# Patient Record
Sex: Male | Born: 1995 | Race: White | Hispanic: No | Marital: Single | Smoking: Current every day smoker
Health system: Southern US, Community
[De-identification: ages and names within clinical notes are randomized; demographics above are authoritative.]

## PROBLEM LIST (undated history)

## (undated) DIAGNOSIS — J189 Pneumonia, unspecified organism: Secondary | ICD-10-CM

## (undated) DIAGNOSIS — F419 Anxiety disorder, unspecified: Secondary | ICD-10-CM

## (undated) HISTORY — PX: MOUTH SURGERY: SHX715

---

## 2016-05-19 ENCOUNTER — Encounter: Payer: Self-pay | Admitting: Emergency Medicine

## 2016-05-19 ENCOUNTER — Emergency Department
Admission: EM | Admit: 2016-05-19 | Discharge: 2016-05-19 | Disposition: A | Discharge status: Home / Self Care | Payer: Self-pay | Attending: Emergency Medicine | Admitting: Emergency Medicine

## 2016-05-19 ENCOUNTER — Emergency Department: Payer: Self-pay

## 2016-05-19 DIAGNOSIS — F17200 Nicotine dependence, unspecified, uncomplicated: Secondary | ICD-10-CM | POA: Insufficient documentation

## 2016-05-19 DIAGNOSIS — R110 Nausea: Secondary | ICD-10-CM | POA: Insufficient documentation

## 2016-05-19 DIAGNOSIS — R05 Cough: Secondary | ICD-10-CM | POA: Insufficient documentation

## 2016-05-19 DIAGNOSIS — F439 Reaction to severe stress, unspecified: Secondary | ICD-10-CM | POA: Insufficient documentation

## 2016-05-19 DIAGNOSIS — R059 Cough, unspecified: Secondary | ICD-10-CM

## 2016-05-19 DIAGNOSIS — R61 Generalized hyperhidrosis: Secondary | ICD-10-CM | POA: Insufficient documentation

## 2016-05-19 LAB — CBC
HEMATOCRIT: 44.4 % (ref 40.0–52.0)
Hemoglobin: 15.6 g/dL (ref 13.0–18.0)
MCH: 33.5 pg (ref 26.0–34.0)
MCHC: 35.1 g/dL (ref 32.0–36.0)
MCV: 95.5 fL (ref 80.0–100.0)
Platelets: 189 10*3/uL (ref 150–440)
RBC: 4.65 MIL/uL (ref 4.40–5.90)
RDW: 12.5 % (ref 11.5–14.5)
WBC: 5.6 10*3/uL (ref 3.8–10.6)

## 2016-05-19 LAB — COMPREHENSIVE METABOLIC PANEL
ALT: 14 U/L — ABNORMAL LOW (ref 17–63)
ANION GAP: 8 (ref 5–15)
AST: 19 U/L (ref 15–41)
Albumin: 4.3 g/dL (ref 3.5–5.0)
Alkaline Phosphatase: 56 U/L (ref 38–126)
BILIRUBIN TOTAL: 0.9 mg/dL (ref 0.3–1.2)
BUN: 12 mg/dL (ref 6–20)
CO2: 24 mmol/L (ref 22–32)
Calcium: 9.1 mg/dL (ref 8.9–10.3)
Chloride: 107 mmol/L (ref 101–111)
Creatinine, Ser: 0.91 mg/dL (ref 0.61–1.24)
GFR calc Af Amer: >60 mL/min (ref >60)
Glucose, Bld: 102 mg/dL — ABNORMAL HIGH (ref 65–99)
POTASSIUM: 4 mmol/L (ref 3.5–5.1)
Sodium: 139 mmol/L (ref 135–145)
TOTAL PROTEIN: 6.8 g/dL (ref 6.5–8.1)

## 2016-05-19 LAB — URINALYSIS COMPLETE WITH MICROSCOPIC (ARMC ONLY)
BACTERIA UA: NONE SEEN
BILIRUBIN URINE: NEGATIVE
Glucose, UA: NEGATIVE mg/dL
HGB URINE DIPSTICK: NEGATIVE
Ketones, ur: NEGATIVE mg/dL
NITRITE: NEGATIVE
PH: 7 (ref 5.0–8.0)
Protein, ur: NEGATIVE mg/dL
Specific Gravity, Urine: 1.012 (ref 1.005–1.030)
Squamous Epithelial / LPF: NONE SEEN

## 2016-05-19 LAB — RAPID HIV SCREEN (HIV 1/2 AB+AG)
HIV 1/2 ANTIBODIES: NONREACTIVE
HIV-1 P24 ANTIGEN - HIV24: NONREACTIVE

## 2016-05-19 NOTE — ED Triage Notes (Signed)
Reports night sweats x 5 months.  Also states has felt nauseated x 5 months.

## 2016-05-19 NOTE — ED Provider Notes (Signed)
[facility] Emergency Department Provider Note   ____________________________________________   First MD Initiated Contact with Patient 05/19/16 [phone]     (approximate)  I have reviewed the triage vital signs and the nursing notes.   HISTORY  Chief Complaint Nausea   HPI Raymond Cole is a 20 y.o. male without any chronic medical problems who is presenting to the emergency department with 5 months of nausea and night sweats. He says that he vomited 3 days ago which prompted him to come to the emergency department for further evaluation. He denies any sick contacts. He says that his room is cool with air conditioning in the evening so he says that he has no reason to be sweating. He also was in jail for most of the year prior to the symptoms starting. He says that the symptoms started about 2 months after leaving jail. He says that he also has an intermittent cough and smokes marijuana daily. He denies any sputum production or bloody sputum. Denies any fever.Patient says that he also has a tremendous amount of stress a little he does not want to be due to psychiatrist. He denies any feelings of suicidal or homicidal ideation. Thinks that his symptoms may be stress related. Denies any nausea at this time.   History reviewed. No pertinent past medical history.  There are no active problems to display for this patient.   History reviewed. No pertinent surgical history.  Prior to Admission medications   Medication Sig Start Date End Date Taking? Authorizing Provider  ibuprofen (ADVIL,MOTRIN) 200 MG tablet Take 800 mg by mouth every 6 (six) hours as needed for fever, mild pain or moderate pain.   Yes Historical Provider, MD    Allergies Review of patient's allergies indicates no known allergies.  No family history on file.  Social History Social History  Substance Use Topics  . Smoking status: Current Every Day Smoker  . Smokeless tobacco: Never Used    . Alcohol use Not on file    Review of Systems Constitutional: No fever/chills Eyes: No visual changes. ENT: No sore throat. Cardiovascular: Denies chest pain. Respiratory: Cough. Gastrointestinal: No abdominal pain.   No diarrhea.  No constipation. Genitourinary: Negative for dysuria. Musculoskeletal: Negative for back pain. Skin: Negative for rash. Neurological: Negative for headaches, focal weakness or numbness.  10-point ROS otherwise negative.  ____________________________________________   PHYSICAL EXAM:  VITAL SIGNS: ED Triage Vitals  Enc Vitals Group     BP 05/19/16 0934 137/90     Pulse Rate 05/19/16 0934 87     Resp 05/19/16 0934 18     Temp 05/19/16 0934 98.3 F (36.8 C)     Temp Source 05/19/16 0934 Oral     SpO2 05/19/16 0934 97 %     Weight 05/19/16 0935 180 lb (81.6 kg)     Height 05/19/16 0935 5\' 10"  (1.778 m)     Head Circumference --      Peak Flow --      Pain Score --      Pain Loc --      Pain Edu? --      Excl. in GC? --     Constitutional: Alert and oriented. Well appearing and in no acute distress. Eyes: Conjunctivae are normal. PERRL. EOMI. Head: Atraumatic. Nose: No congestion/rhinnorhea. Mouth/Throat: Mucous membranes are moist.  Neck: No stridor.   Cardiovascular: Normal rate, regular rhythm. Grossly normal heart sounds.  Good peripheral circulation. Respiratory: Normal respiratory effort.  No retractions. Lungs CTAB. Gastrointestinal: Soft and nontender. No distention. No abdominal bruits. No CVA tenderness. Musculoskeletal: No lower extremity tenderness nor edema.  No joint effusions. Neurologic:  Normal speech and language. No gross focal neurologic deficits are appreciated. No gait instability. Skin:  Skin is warm, dry and intact. No rash noted. Psychiatric: Mood and affect are normal. Speech and behavior are normal.  ____________________________________________   LABS (all labs ordered are listed, but only abnormal results  are displayed)  Labs Reviewed  COMPREHENSIVE METABOLIC PANEL - Abnormal; Notable for the following:       Result Value   Glucose, Bld 102 (*)    ALT 14 (*)    All other components within normal limits  URINALYSIS COMPLETEWITH MICROSCOPIC ([facility] ONLY) - Abnormal; Notable for the following:    Color, Urine YELLOW (*)    APPearance CLEAR (*)    Leukocytes, UA TRACE (*)    All other components within normal limits  CBC  RAPID HIV SCREEN (HIV 1/2 AB+AG)   ____________________________________________  EKG   ____________________________________________  RADIOLOGY  DG Chest 2 View (Accession [id number]) (Order [id number])  Imaging  Date: 05/19/2016 Department: [facility] EMERGENCY DEPARTMENT Released By: Ludwig LeanValerie Anne Chandler, RN (auto-released) Authorizing: Myrna Blazeravid Matthew Schaevitz, MD  PACS Images   Show images for DG Chest 2 View  Study Result   CLINICAL DATA:  Nonproductive cough for 3-4 months  EXAM: CHEST  2 VIEW  COMPARISON:  01/23/2012  FINDINGS: The heart size and mediastinal contours are within normal limits. Both lungs are clear. The visualized skeletal structures are unremarkable.  IMPRESSION: No active cardiopulmonary disease.   Electronically Signed   By: Elige KoHetal  Patel   On: 05/19/2016 11:09     ____________________________________________   PROCEDURES  Procedure(s) performed:   Procedures  Critical Care performed:   ____________________________________________   INITIAL IMPRESSION / ASSESSMENT AND PLAN / ED COURSE  Pertinent labs & imaging results that were available during my care of the patient were reviewed by me and considered in my medical decision making (see chart for details).  ----------------------------------------- 12:22 PM on 05/19/2016 -----------------------------------------  Patient resting a little bit this time. Continues to be without any nausea or vomiting. I discussed case Dr.  Sampson GoonFitzgerald the infectious to service he recommends an HIV test and given the patient does not have any active cough or bloody sputum or white count this time says that the patient sounds appropriate follow-up at the health department. The patient said that 2-3 months ago he had a negative HIV test. We'll retest at this time. Also said that he negative TB test in jail but the symptoms started after release. He understands the need for follow-up with the health department and is willing to comply. I will also be giving him follow-up information for [facility] because of his anxiety. He continues to deny any suicidal or homicidal ideation.  Clinical Course     ____________________________________________   FINAL CLINICAL IMPRESSION(S) / ED DIAGNOSES  stress. Nausea and night sweats.    NEW MEDICATIONS STARTED DURING THIS VISIT:  New Prescriptions   No medications on file     Note:  This document was prepared using Dragon voice recognition software and may include unintentional dictation errors.    Myrna Blazeravid Matthew Schaevitz, MD 05/19/16 [phone]

## 2017-03-04 ENCOUNTER — Encounter: Payer: Self-pay | Admitting: Emergency Medicine

## 2017-03-04 ENCOUNTER — Emergency Department: Payer: BLUE CROSS/BLUE SHIELD

## 2017-03-04 ENCOUNTER — Emergency Department
Admission: EM | Admit: 2017-03-04 | Discharge: 2017-03-04 | Disposition: A | Discharge status: Home / Self Care | Payer: BLUE CROSS/BLUE SHIELD | Attending: Student in an Organized Health Care Education/Training Program | Admitting: Student in an Organized Health Care Education/Training Program

## 2017-03-04 DIAGNOSIS — F1094 Alcohol use, unspecified with alcohol-induced mood disorder: Secondary | ICD-10-CM

## 2017-03-04 DIAGNOSIS — F17210 Nicotine dependence, cigarettes, uncomplicated: Secondary | ICD-10-CM | POA: Diagnosis not present

## 2017-03-04 DIAGNOSIS — Y998 Other external cause status: Secondary | ICD-10-CM | POA: Diagnosis not present

## 2017-03-04 DIAGNOSIS — S6991XA Unspecified injury of right wrist, hand and finger(s), initial encounter: Secondary | ICD-10-CM | POA: Diagnosis present

## 2017-03-04 DIAGNOSIS — W25XXXA Contact with sharp glass, initial encounter: Secondary | ICD-10-CM | POA: Diagnosis not present

## 2017-03-04 DIAGNOSIS — S61421A Laceration with foreign body of right hand, initial encounter: Secondary | ICD-10-CM | POA: Insufficient documentation

## 2017-03-04 DIAGNOSIS — F1010 Alcohol abuse, uncomplicated: Secondary | ICD-10-CM

## 2017-03-04 DIAGNOSIS — Y939 Activity, unspecified: Secondary | ICD-10-CM | POA: Insufficient documentation

## 2017-03-04 DIAGNOSIS — Y929 Unspecified place or not applicable: Secondary | ICD-10-CM | POA: Insufficient documentation

## 2017-03-04 DIAGNOSIS — F10929 Alcohol use, unspecified with intoxication, unspecified: Secondary | ICD-10-CM

## 2017-03-04 DIAGNOSIS — Y903 Blood alcohol level of 60-79 mg/100 ml: Secondary | ICD-10-CM | POA: Diagnosis not present

## 2017-03-04 LAB — CBC WITH DIFFERENTIAL/PLATELET
BASOS ABS: 0 10*3/uL (ref 0–0.1)
Basophils Relative: 0 %
EOS ABS: 0.2 10*3/uL (ref 0–0.7)
EOS PCT: 2 %
HCT: 47.8 % (ref 40.0–52.0)
Hemoglobin: 16.2 g/dL (ref 13.0–18.0)
LYMPHS ABS: 2.1 10*3/uL (ref 1.0–3.6)
Lymphocytes Relative: 20 %
MCH: 32.6 pg (ref 26.0–34.0)
MCHC: 33.9 g/dL (ref 32.0–36.0)
MCV: 96.1 fL (ref 80.0–100.0)
MONO ABS: 0.5 10*3/uL (ref 0.2–1.0)
Monocytes Relative: 5 %
NEUTROS PCT: 73 %
Neutro Abs: 7.8 10*3/uL — ABNORMAL HIGH (ref 1.4–6.5)
PLATELETS: 282 10*3/uL (ref 150–440)
RBC: 4.97 MIL/uL (ref 4.40–5.90)
RDW: 12.3 % (ref 11.5–14.5)
WBC: 10.8 10*3/uL — ABNORMAL HIGH (ref 3.8–10.6)

## 2017-03-04 LAB — URINE DRUG SCREEN, QUALITATIVE (ARMC ONLY)
AMPHETAMINES, UR SCREEN: NOT DETECTED
Barbiturates, Ur Screen: NOT DETECTED
Benzodiazepine, Ur Scrn: POSITIVE — AB
COCAINE METABOLITE, UR ~~LOC~~: POSITIVE — AB
Cannabinoid 50 Ng, Ur ~~LOC~~: POSITIVE — AB
MDMA (ECSTASY) UR SCREEN: NOT DETECTED
METHADONE SCREEN, URINE: NOT DETECTED
Opiate, Ur Screen: NOT DETECTED
Phencyclidine (PCP) Ur S: NOT DETECTED
TRICYCLIC, UR SCREEN: NOT DETECTED

## 2017-03-04 LAB — COMPREHENSIVE METABOLIC PANEL
ALT: 32 U/L (ref 17–63)
AST: 34 U/L (ref 15–41)
Albumin: 4.6 g/dL (ref 3.5–5.0)
Alkaline Phosphatase: 66 U/L (ref 38–126)
Anion gap: 12 (ref 5–15)
BUN: 14 mg/dL (ref 6–20)
CHLORIDE: 105 mmol/L (ref 101–111)
CO2: 23 mmol/L (ref 22–32)
CREATININE: 0.97 mg/dL (ref 0.61–1.24)
Calcium: 9.4 mg/dL (ref 8.9–10.3)
GFR calc non Af Amer: >60 mL/min (ref >60)
Glucose, Bld: 101 mg/dL — ABNORMAL HIGH (ref 65–99)
POTASSIUM: 4 mmol/L (ref 3.5–5.1)
SODIUM: 140 mmol/L (ref 135–145)
Total Bilirubin: 0.6 mg/dL (ref 0.3–1.2)
Total Protein: 7.6 g/dL (ref 6.5–8.1)

## 2017-03-04 LAB — ETHANOL: ALCOHOL ETHYL (B): 60 mg/dL — AB (ref <5)

## 2017-03-04 MED ORDER — BUPIVACAINE HCL (PF) 0.5 % IJ SOLN
INTRAMUSCULAR | Status: AC
  Filled 2017-03-04: qty 30

## 2017-03-04 MED ORDER — BUPIVACAINE HCL (PF) 0.5 % IJ SOLN
30.0000 mL | Freq: Once | INTRAMUSCULAR | Status: DC
  Filled 2017-03-04: qty 30

## 2017-03-04 MED ORDER — CEPHALEXIN 500 MG PO CAPS: 500.0000 mg | ORAL_CAPSULE | Freq: Three times a day (TID) | ORAL | 0 refills | Status: AC

## 2017-03-04 MED ORDER — LIDOCAINE-EPINEPHRINE-TETRACAINE (LET) SOLUTION
3.0000 mL | Freq: Once | NASAL | Status: AC
  Administered 2017-03-04: 3 mL via TOPICAL
  Filled 2017-03-04: qty 3

## 2017-03-04 NOTE — ED Notes (Signed)
Pt given warm blanket, sprite and two graham crackers and peanut butter. Pt wants to leave and says he is not SI. Pt informed he had to wait on Dr. Toni Amendlapacs and we will see what he says. Pt is resting at this time in a dark room with no tv. Pt is being monitored.

## 2017-03-04 NOTE — ED Notes (Signed)

## 2017-03-04 NOTE — ED Notes (Signed)
MD Robinson at bedside 

## 2017-03-04 NOTE — ED Notes (Signed)
Dr. Clapac in with pt. 

## 2017-03-04 NOTE — ED Provider Notes (Signed)
[facility] Emergency Department Provider Note    First MD Initiated Contact with Patient 03/04/17 [phone]     (approximate)  I have reviewed the triage vital signs and the nursing notes.   HISTORY  Chief Complaint Extremity Laceration    HPI Raymond Cole is a 21 y.o. male who admits to being intoxicated last night and punched through a glass window "because he felt like it" and sustained a 3 cm lacerationto the volar aspect of his right wrist. Denies any intent to harm himself or others. Patient arrives calm in no acute distress. Denies any previous history of self-harm. States his tetanus is up-to-date. Patient very agitated and intoxicated appearing. Uncooperative with staff. States that he's lost lots of blood but refuses to have blood work drawn. Does not want wound repaired. Due to his suspected intoxication with concern for self-harm patient will be IVC to further evaluate and stabilize patient.   History reviewed. No pertinent past medical history. No family history on file. History reviewed. No pertinent surgical history. Patient Active Problem List   Diagnosis Date Noted  . 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 Alcohol intoxication (HCC) 03/04/2017      Prior to Admission medications   Medication Sig Start Date End Date Taking? Authorizing Provider  cephALEXin (KEFLEX) 500 MG capsule Take 1 capsule (500 mg total) by mouth 3 (three) times daily. 03/04/17 03/11/17  Willy Eddy, MD  ibuprofen (ADVIL,MOTRIN) 200 MG tablet Take 800 mg by mouth every 6 (six) hours as needed for fever, mild pain or moderate pain.    [provider]    Allergies Patient has no known allergies.    Social History Social History  Substance Use Topics  . Smoking status: Current Every Day Smoker    Packs/day: 1.00    Types: Cigarettes  . Smokeless tobacco: Never Used  . Alcohol use Yes    Review of Systems Patient denies  headaches, rhinorrhea, blurry vision, numbness, shortness of breath, chest pain, edema, cough, abdominal pain, nausea, vomiting, diarrhea, dysuria, fevers, rashes or hallucinations unless otherwise stated above in HPI. ____________________________________________   PHYSICAL EXAM:  VITAL SIGNS: Vitals:   03/04/17 0832 03/04/17 1347  BP: 122/80   Pulse:  94  Resp:  14  Temp:  98.5 F (36.9 C)    Constitutional: Alert and oriented. in no acute distress. Eyes: Conjunctivae are normal.  Head: Atraumatic. Nose: No congestion/rhinnorhea. Mouth/Throat: Mucous membranes are moist.   Neck: Painless ROM.  Cardiovascular:   Good peripheral circulation. Respiratory: Normal respiratory effort.  No retractions.  Gastrointestinal: Soft and nontender.  Musculoskeletal: No lower extremity tenderness .  No joint effusions. Neurologic:  Normal speech and language. No gross focal neurologic deficits are appreciated.  Skin:  3cm full thickness laceration to right volar forearm.  No evidence of tendon injury.  N/Vintact distally Psychiatric: Mood and affect are normal. Speech and behavior are normal.  ____________________________________________   LABS (all labs ordered are listed, but only abnormal results are displayed)  Results for orders placed or performed during the hospital encounter of 03/04/17 (from the past 24 hour(s))  CBC with Differential     Status: Abnormal   Collection Time: 03/04/17  9:43 AM  Result Value Ref Range   WBC 10.8 (H) 3.8 - 10.6 K/uL   RBC 4.97 4.40 - 5.90 MIL/uL   Hemoglobin 16.2 13.0 - 18.0 g/dL   HCT 96.0 45.4 - 09.8 %   MCV 96.1 80.0 - 100.0 fL  MCH 32.6 26.0 - 34.0 pg   MCHC 33.9 32.0 - 36.0 g/dL   RDW 40.9 81.1 - 91.4 %   Platelets 282 150 - 440 K/uL   Neutrophils Relative % 73 %   Neutro Abs 7.8 (H) 1.4 - 6.5 K/uL   Lymphocytes Relative 20 %   Lymphs Abs 2.1 1.0 - 3.6 K/uL   Monocytes Relative 5 %   Monocytes Absolute 0.5 0.2 - 1.0 K/uL    Eosinophils Relative 2 %   Eosinophils Absolute 0.2 0 - 0.7 K/uL   Basophils Relative 0 %   Basophils Absolute 0.0 0 - 0.1 K/uL  Comprehensive metabolic panel     Status: Abnormal   Collection Time: 03/04/17  9:43 AM  Result Value Ref Range   Sodium 140 135 - 145 mmol/L   Potassium 4.0 3.5 - 5.1 mmol/L   Chloride 105 101 - 111 mmol/L   CO2 23 22 - 32 mmol/L   Glucose, Bld 101 (H) 65 - 99 mg/dL   BUN 14 6 - 20 mg/dL   Creatinine, Ser 7.82 0.61 - 1.24 mg/dL   Calcium 9.4 8.9 - 95.6 mg/dL   Total Protein 7.6 6.5 - 8.1 g/dL   Albumin 4.6 3.5 - 5.0 g/dL   AST 34 15 - 41 U/L   ALT 32 17 - 63 U/L   Alkaline Phosphatase 66 38 - 126 U/L   Total Bilirubin 0.6 0.3 - 1.2 mg/dL   GFR calc non Af Amer >60 >60 mL/min   GFR calc Af Amer >60 >60 mL/min   Anion gap 12 5 - 15  Ethanol     Status: Abnormal   Collection Time: 03/04/17  9:43 AM  Result Value Ref Range   Alcohol, Ethyl (B) 60 (H) <5 mg/dL  Urine Drug Screen, Qualitative ([facility] only)     Status: Abnormal   Collection Time: 03/04/17  9:43 AM  Result Value Ref Range   Tricyclic, Ur Screen NONE DETECTED NONE DETECTED   Amphetamines, Ur Screen NONE DETECTED NONE DETECTED   MDMA (Ecstasy)Ur Screen NONE DETECTED NONE DETECTED   Cocaine Metabolite,Ur [place] POSITIVE (A) NONE DETECTED   Opiate, Ur Screen NONE DETECTED NONE DETECTED   Phencyclidine (PCP) Ur S NONE DETECTED NONE DETECTED   Cannabinoid 50 Ng, Ur [place] POSITIVE (A) NONE DETECTED   Barbiturates, Ur Screen NONE DETECTED NONE DETECTED   Benzodiazepine, Ur Scrn POSITIVE (A) NONE DETECTED   Methadone Scn, Ur NONE DETECTED NONE DETECTED   ____________________________________________ ____________________________________________  RADIOLOGY  I personally reviewed all radiographic images ordered to evaluate for the above acute complaints and reviewed radiology reports and findings.  These findings were personally discussed with the patient.  Please see medical record for radiology  report.  ____________________________________________   PROCEDURES  Procedure(s) performed:  Marland KitchenMarland KitchenLaceration Repair Date/Time: 03/04/2017 9:38 AM Performed by: Willy Eddy Authorized by: Willy Eddy   Consent:    Consent obtained:  Verbal   Consent given by:  Patient Anesthesia (see MAR for exact dosages):    Anesthesia method:  Local infiltration   Local anesthetic:  Bupivacaine 0.5% w/o epi Laceration details:    Location:  Shoulder/arm   Shoulder/arm location:  R lower arm   Length (cm):  3   Depth (mm):  4 Repair type:    Repair type:  Intermediate Pre-procedure details:    Preparation:  Patient was prepped and draped in usual sterile fashion Exploration:    Wound extent: foreign bodies/material     Wound extent  comment:  4 glass foreign bodies removed   Contaminated: yes        Critical Care performed: no ____________________________________________   INITIAL IMPRESSION / ASSESSMENT AND PLAN / ED COURSE  Pertinent labs & imaging results that were available during my care of the patient were reviewed by me and considered in my medical decision making (see chart for details).  DDX: laceration, retained fb, tendon injury, vascular injury, SI , polysubstance abuse  Lahoma RockerBrandon D Pember is a 21 y.o. who presents to the ED with intoxication and laceration as described above with evidence of foreign bodies. Patient very uncooperative with staff and further evaluated and appropriately care for this patient and he was placed under IVC. Wound does not appear overtly self-inflicted but given his intoxication and uncooperative nature concern for self-induced injury is considered.  The patient will be placed on continuous pulse oximetry and telemetry for monitoring.  Laboratory evaluation will be sent to evaluate for the above complaints.  Patient again denies any formal SI or HI.   Clinical Course as of Mar 04 1404  Wed Mar 04, 2017  1001 Patient is now agreeing to  receive medical evaluation. Remains hemodynamic stable. Reiterates that he was not intending to harm himself and has no suicidal ideation but admits to being intoxicated. Repeat x-ray does show evidence of a persistent single retained foreign body however this foreign body is likely very deep in due to my concern to its proximity to neurovascular structures we'll give referral to orthopedic hand surgery. Will start patient on antibiotics.  [PR]  1106 Patient resting comfortably. Blood work is reassuring. I spoke with the patient's father who states the patient has never demonstrated any intent for self-harm. The patient did punch a car window while he was intoxicated last night.  [PR]    Clinical Course User Index [PR] Willy Eddyobinson, Lashell Moffitt, MD      ____________________________________________   FINAL CLINICAL IMPRESSION(S) / ED DIAGNOSES  Final diagnoses:  Laceration of right hand with foreign body, initial encounter  ETOH abuse      NEW MEDICATIONS STARTED DURING THIS VISIT:  New Prescriptions   CEPHALEXIN (KEFLEX) 500 MG CAPSULE    Take 1 capsule (500 mg total) by mouth 3 (three) times daily.     Note:  This document was prepared using Dragon voice recognition software and may include unintentional dictation errors.     Willy Eddyobinson, Reida Hem, MD 03/04/17 1425

## 2017-03-04 NOTE — ED Notes (Signed)
PT  PUT  UNDER  IVC  PER  DR Roxan HockeyOBINSON  INFORMED  RN   Kerney ElbeKIM MANN

## 2017-03-04 NOTE — ED Notes (Signed)
Pt dressed out into our behavioral scrubs with this tech, ods Jimmy and [employer] present; pt had a shirt, pants, shoes, and boxer underwear all placed in belongings bag and labeled and given to quad, RN; pt also had blood drawn and sent to lab; pt given urine cup when moved from room 14 to 21

## 2017-03-04 NOTE — Consult Note (Signed)
[place] [facility] Consult   Reason for Consult:  Consult for 21 year old man who came into the emergency room after sustaining a laceration to his arm after punching out a window Referring Physician:  Quentin Cornwall Patient Identification: Raymond Cole MRN:  [id number] Principal Diagnosis: Alcohol-induced mood disorder [facility]) Diagnosis:   Patient Active Problem List   Diagnosis Date Noted  . Alcohol-induced mood disorder ([place]) [F10.94] 03/04/2017  . Alcohol intoxication ([place]) [F10.929] 03/04/2017    Total Time spent with patient: 45 minutes  Subjective:   Raymond Cole is a 21 y.o. male patient admitted with "I cut my arm".  HPI:  21 year old man came to the emergency room after apparently punching a window and sustaining a laceration to his arm. Judging from the chart it looks like he has not been very forthcoming with information throughout the whole process. He was put on IVC because he appeared to be intoxicated and there was concern about behavior problems. By the time I saw him Dr. Quentin Cornwall was rescinding the IVC although he still asked me to proceed with the consult. Patient states that he punched out a window of a car. He was at his home when he did it. He tells me that he and his girlfriend had been arguing and that he was feeling angry which is why he punched the window. He also admits that he had been consuming alcohol. He refuses to tell me or gas how much alcohol he had been drinking. Denies he was using other drugs. Patient says that other than that his mood had been fine. He is not a very forthcoming historian and tends to answer most of these questions in a couple of words and then returned to the topic of demanding to be released from the emergency room. Patient completely denies there was any intention to harm himself. Denies that at any time he had any intention or thought of harming his girlfriend or anyone else. He claims that he only drinks alcohol "about twice a  year" and denies other drug use.  Social history: Does landscaping work. Lives with his girlfriend. Suggest that there are other family members at home but is not any more talkative about it.  Medical history: Denies any significant medical problems other than this acute laceration which has now been sutured  Substance abuse history: Claims that he only drinks alcohol "a couple times a year". Denies other drug use. Nothing in the old chart about substance use problems.  Past Psychiatric History: Patient denies any past psychiatric history at all. Denies history of suicidality or violence. Nothing in the old chart to suggest otherwise  Risk to Self:   Risk to Others:   Prior Inpatient Therapy:   Prior Outpatient Therapy:    Past Medical History: History reviewed. No pertinent past medical history. History reviewed. No pertinent surgical history. Family History: No family history on file. Family Psychiatric  History: Denies any family history Social History:  History  Alcohol Use  . Yes     History  Drug Use  . Types: Marijuana    Social History   Social History  . Marital status: Single    Spouse name: N/A  . Number of children: N/A  . Years of education: N/A   Social History Main Topics  . Smoking status: Current Every Day Smoker    Packs/day: 1.00    Types: Cigarettes  . Smokeless tobacco: Never Used  . Alcohol use Yes  . Drug use: Yes  Types: Marijuana  . Sexual activity: Not Asked   Other Topics Concern  . None   Social History Narrative  . None   Additional Social History:    Allergies:  No Known Allergies  Labs:  Results for orders placed or performed during the hospital encounter of 03/04/17 (from the past 48 hour(s))  CBC with Differential     Status: Abnormal   Collection Time: 03/04/17  9:43 AM  Result Value Ref Range   WBC 10.8 (H) 3.8 - 10.6 K/uL   RBC 4.97 4.40 - 5.90 MIL/uL   Hemoglobin 16.2 13.0 - 18.0 g/dL   HCT 47.8 40.0 - 52.0 %    MCV 96.1 80.0 - 100.0 fL   MCH 32.6 26.0 - 34.0 pg   MCHC 33.9 32.0 - 36.0 g/dL   RDW 12.3 11.5 - 14.5 %   Platelets 282 150 - 440 K/uL   Neutrophils Relative % 73 %   Neutro Abs 7.8 (H) 1.4 - 6.5 K/uL   Lymphocytes Relative 20 %   Lymphs Abs 2.1 1.0 - 3.6 K/uL   Monocytes Relative 5 %   Monocytes Absolute 0.5 0.2 - 1.0 K/uL   Eosinophils Relative 2 %   Eosinophils Absolute 0.2 0 - 0.7 K/uL   Basophils Relative 0 %   Basophils Absolute 0.0 0 - 0.1 K/uL  Comprehensive metabolic panel     Status: Abnormal   Collection Time: 03/04/17  9:43 AM  Result Value Ref Range   Sodium 140 135 - 145 mmol/L   Potassium 4.0 3.5 - 5.1 mmol/L   Chloride 105 101 - 111 mmol/L   CO2 23 22 - 32 mmol/L   Glucose, Bld 101 (H) 65 - 99 mg/dL   BUN 14 6 - 20 mg/dL   Creatinine, Ser 0.97 0.61 - 1.24 mg/dL   Calcium 9.4 8.9 - 10.3 mg/dL   Total Protein 7.6 6.5 - 8.1 g/dL   Albumin 4.6 3.5 - 5.0 g/dL   AST 34 15 - 41 U/L   ALT 32 17 - 63 U/L   Alkaline Phosphatase 66 38 - 126 U/L   Total Bilirubin 0.6 0.3 - 1.2 mg/dL   GFR calc non Af Amer >60 >60 mL/min   GFR calc Af Amer >60 >60 mL/min    Comment: (NOTE) The eGFR has been calculated using the CKD EPI equation. This calculation has not been validated in all clinical situations. eGFR's persistently <60 mL/min signify possible Chronic Kidney Disease.    Anion gap 12 5 - 15  Ethanol     Status: Abnormal   Collection Time: 03/04/17  9:43 AM  Result Value Ref Range   Alcohol, Ethyl (B) 60 (H) <5 mg/dL    Comment:        LOWEST DETECTABLE LIMIT FOR SERUM ALCOHOL IS 5 mg/dL FOR MEDICAL PURPOSES ONLY     Current Facility-Administered Medications  Medication Dose Route Frequency Provider Last Rate Last Dose  . bupivacaine (MARCAINE) 0.5 % injection 30 mL  30 mL Infiltration Once Merlyn Lot, MD      . bupivacaine (MARCAINE) 0.5 % injection            Current Outpatient Prescriptions  Medication Sig Dispense Refill  . ibuprofen  (ADVIL,MOTRIN) 200 MG tablet Take 800 mg by mouth every 6 (six) hours as needed for fever, mild pain or moderate pain.      Musculoskeletal: Strength & Muscle Tone: within normal limits Gait & Station: normal Patient leans: N/A  Psychiatric  Specialty Exam: Physical Exam  Constitutional: He appears well-developed and well-nourished.  HENT:  Head: Normocephalic and atraumatic.  Eyes: Conjunctivae are normal. Pupils are equal, round, and reactive to light.  Neck: Normal range of motion.  Cardiovascular: Normal heart sounds.   Respiratory: Effort normal.  GI: Soft.  Musculoskeletal: Normal range of motion.  Neurological: He is alert.  Skin: Skin is warm and dry.     Psychiatric: His affect is blunt. His speech is delayed. He is slowed. Thought content is not paranoid. He expresses impulsivity. He expresses no homicidal and no suicidal ideation. He exhibits abnormal recent memory.    Review of Systems  Constitutional: Negative.   HENT: Negative.   Eyes: Negative.   Respiratory: Negative.   Cardiovascular: Negative.   Gastrointestinal: Negative.   Musculoskeletal: Negative.   Skin: Negative.   Neurological: Negative.   Psychiatric/Behavioral: Negative.     Blood pressure 122/80, pulse 94, temperature 98.5 F (36.9 C), temperature source Oral, resp. rate 14, height _0  (1.727 m), weight 81.6 kg (180 lb), SpO2 98 %.Body mass index is 27.37 kg/m.  General Appearance: Fairly Groomed  Eye Contact:  Minimal  Speech:  Slow  Volume:  Decreased  Mood:  Irritable  Affect:  Blunt  Thought Process:  Goal Directed  Orientation:  Full (Time, Place, and Person)  Thought Content:  Illogical  Suicidal Thoughts:  No  Homicidal Thoughts:  No  Memory:  Immediate;   Fair Recent;   Poor Remote;   Fair  Judgement:  Fair  Insight:  Fair  Psychomotor Activity:  Decreased  Concentration:  Concentration: Fair  Recall:  [employer] of Knowledge:  Fair  Language:  Fair  Akathisia:  No    Handed:  Right  AIMS (if indicated):     Assets:  Desire for Improvement Housing Physical Health  ADL's:  Intact  Cognition:  WNL  Sleep:        Treatment Plan Summary: Plan 21 year old man who punched out a window while drunk. It doesn't look like any alcohol level was done or any drug screen was done so we don't have any evidence as to how intoxicated he was. At no point is their documentation of his making suicidal statements. On my interview today the patient completely denies any suicidal or homicidal thought. He expresses regret for what he did. He claims that he does not have an alcohol problem and denies any thoughts of hurting anyone else or himself. He indicates that this makes him understand that getting drunk is not a good idea for him and he will try not to do it again. At this point there is certainly no indication for involuntary commitment or for any specific psychiatric follow-up for this patient. Patient was encouraged to stick with the idea of not getting drunk again and that if they're ever came a time when mood symptoms or behavior was concerning he can always go for outpatient treatment. Case reviewed with emergency room physician and staff. Patient can be discharged.  Disposition: Patient does not meet criteria for psychiatric inpatient admission. Supportive therapy provided about ongoing stressors.  Alethia Berthold, MD 03/04/2017 1:51 PM

## 2017-03-04 NOTE — ED Notes (Signed)
BEHAVIORAL HEALTH ROUNDING Patient sleeping: Yes.   Patient alert and oriented: eyes closed  Appears asleep Behavior appropriate: Yes.  ; If no, describe:  Nutrition and fluids offered: Yes  Toileting and hygiene offered: sleeping Sitter present: q 15 minute observations and security monitoring Law enforcement present: yes  ODS 

## 2017-03-04 NOTE — ED Notes (Signed)
Pt given lunch tray.

## 2017-03-04 NOTE — ED Triage Notes (Addendum)
Patient presents to the ED with laceration to his right forearm.  Patient states, "I think there is some glass in it."  Patient refused to explain how injury occurred.  Right Arm appears slightly deformed at injury site.  Bleeding is controlled at this time.  Patient's speech is slightly slurred.

## 2017-03-04 NOTE — ED Notes (Signed)
BEHAVIORAL HEALTH ROUNDING Patient sleeping: No. Patient alert and oriented: yes Behavior appropriate: Yes.  ; If no, describe:  Nutrition and fluids offered: yes Toileting and hygiene offered: Yes  Sitter present: q15 minute observations and security  monitoring Law enforcement present: Yes  ODS  

## 2017-03-04 NOTE — ED Notes (Signed)
PT  PUT  UNDER  IVC  PER  DR ROBINSON  INFORMED  RN   KIM MANN/  PENDING  CONSULT

## 2017-03-04 NOTE — ED Notes (Signed)
Pt refusing to allow ED tech to draw blood. MD Roxan Hockeyobinson notified.

## 2017-03-04 NOTE — ED Notes (Signed)
IVC  PAPERS  RESCINDED  PER  DR  ROBINSON INFORMED AMY TEAGUE  RN

## 2017-03-19 ENCOUNTER — Other Ambulatory Visit: Payer: Self-pay | Admitting: Specialist

## 2017-03-23 ENCOUNTER — Inpatient Hospital Stay: Admission: RE | Admit: 2017-03-23 | Payer: BLUE CROSS/BLUE SHIELD | Source: Ambulatory Visit

## 2017-03-24 ENCOUNTER — Encounter
Admission: RE | Admit: 2017-03-24 | Discharge: 2017-03-24 | Disposition: A | Discharge status: Home / Self Care | Payer: BLUE CROSS/BLUE SHIELD | Source: Ambulatory Visit | Attending: Specialist | Admitting: Specialist

## 2017-03-24 HISTORY — DX: Anxiety disorder, unspecified: F41.9

## 2017-03-24 HISTORY — DX: Pneumonia, unspecified organism: J18.9

## 2017-03-24 MED ORDER — CLINDAMYCIN PHOSPHATE 600 MG/50ML IV SOLN
600.0000 mg | INTRAVENOUS | Status: DC
  Filled 2017-03-24: qty 50

## 2017-03-24 MED ORDER — CEFAZOLIN SODIUM-DEXTROSE 2-4 GM/100ML-% IV SOLN: 2.0000 g | INTRAVENOUS | Status: DC

## 2017-03-24 NOTE — Patient Instructions (Signed)
  Your procedure is scheduled on: 03-25-17 Report to Same Day Surgery 2nd floor medical mall [facility](Medical Mall Entrance-take elevator on left to 2nd floor.  Check in with surgery information desk.) To find out your arrival time please call [phone] between 1PM - 3PM on 03-24-17  Remember: Instructions that are not followed completely may result in serious medical risk, up to and including death, or upon the discretion of your surgeon and anesthesiologist your surgery may need to be rescheduled.    _x___ 1. Do not eat food or drink liquids after midnight. No gum chewing or  hard candies.     __x__ 2. No Alcohol for 24 hours before or after surgery.   __x__3. No Smoking for 24 prior to surgery.   ____  4. Bring all medications with you on the day of surgery if instructed.    __x__ 5. Notify your doctor if there is any change in your medical condition     (cold, fever, infections).     Do not wear jewelry, make-up, hairpins, clips or nail polish.  Do not wear lotions, powders, or perfumes. You may wear deodorant.  Do not shave 48 hours prior to surgery. Men may shave face and neck.  Do not bring valuables to the hospital.    [facility] is not responsible for any belongings or valuables.               Contacts, dentures or bridgework may not be worn into surgery.  Leave your suitcase in the car. After surgery it may be brought to your room.  For patients admitted to the hospital, discharge time is determined by your                       treatment team.   Patients discharged the day of surgery will not be allowed to drive home.  You will need someone to drive you home and stay with you the night of your procedure.    Please read over the following fact sheets that you were given:   [facility] Preparing for Surgery and or MRSA Information   ____ Take anti-hypertensive (unless it includes a diuretic), cardiac, seizure, asthma,     anti-reflux and psychiatric medicines. These include:  1.  NONE  2.  3.  4.  5.  6.  ____Fleets enema or Magnesium Citrate as directed.   ____ Use CHG Soap or sage wipes as directed on instruction sheet   ____ Use inhalers on the day of surgery and bring to hospital day of surgery  ____ Stop Metformin and Janumet 2 days prior to surgery.    ____ Take 1/2 of usual insulin dose the night before surgery and none on the morning     surgery.   ____ Follow recommendations from Cardiologist, Pulmonologist or PCP regarding          stopping Aspirin, Coumadin, Pllavix ,Eliquis, Effient, or Pradaxa, and Pletal.  X____Stop Anti-inflammatories such as Advil, Aleve, IBUPROFEN, Motrin, Naproxen, Naprosyn, Goodies powders or aspirin products NOW- OK to take Tylenol   ____ Stop supplements until after surgery.     ____ Bring C-Pap to the hospital.

## 2017-03-25 ENCOUNTER — Encounter: Payer: Self-pay | Admitting: Anesthesiology

## 2017-03-25 ENCOUNTER — Encounter
Admission: RE | Disposition: A | Discharge status: Home / Self Care | Payer: Self-pay | Source: Ambulatory Visit | Attending: Specialist

## 2017-03-25 ENCOUNTER — Ambulatory Visit
Admission: RE | Admit: 2017-03-25 | Discharge: 2017-03-25 | Disposition: A | Discharge status: Home / Self Care | Payer: BLUE CROSS/BLUE SHIELD | Source: Ambulatory Visit | Attending: Specialist | Admitting: Specialist

## 2017-03-25 ENCOUNTER — Encounter: Payer: Self-pay | Admitting: *Deleted

## 2017-03-25 DIAGNOSIS — T148XXA Other injury of unspecified body region, initial encounter: Secondary | ICD-10-CM | POA: Diagnosis not present

## 2017-03-25 DIAGNOSIS — F1490 Cocaine use, unspecified, uncomplicated: Secondary | ICD-10-CM | POA: Insufficient documentation

## 2017-03-25 DIAGNOSIS — X58XXXA Exposure to other specified factors, initial encounter: Secondary | ICD-10-CM | POA: Diagnosis not present

## 2017-03-25 DIAGNOSIS — Z538 Procedure and treatment not carried out for other reasons: Secondary | ICD-10-CM | POA: Insufficient documentation

## 2017-03-25 SURGERY — FOREIGN BODY REMOVAL ADULT
Anesthesia: Choice | Laterality: Right

## 2017-03-25 MED ORDER — FAMOTIDINE 20 MG PO TABS: 20.0000 mg | ORAL_TABLET | Freq: Once | ORAL | Status: DC

## 2017-03-25 MED ORDER — GLYCOPYRROLATE 0.2 MG/ML IJ SOLN
INTRAMUSCULAR | Status: AC
  Filled 2017-03-25: qty 1

## 2017-03-25 MED ORDER — LIDOCAINE HCL (PF) 2 % IJ SOLN
INTRAMUSCULAR | Status: AC
  Filled 2017-03-25: qty 2

## 2017-03-25 MED ORDER — MIDAZOLAM HCL 2 MG/2ML IJ SOLN
INTRAMUSCULAR | Status: AC
  Filled 2017-03-25: qty 2

## 2017-03-25 MED ORDER — BUPIVACAINE HCL (PF) 0.5 % IJ SOLN
INTRAMUSCULAR | Status: AC
  Filled 2017-03-25: qty 30

## 2017-03-25 MED ORDER — CHLORHEXIDINE GLUCONATE CLOTH 2 % EX PADS: 6.0000 | MEDICATED_PAD | Freq: Once | CUTANEOUS | Status: DC

## 2017-03-25 MED ORDER — FENTANYL CITRATE (PF) 100 MCG/2ML IJ SOLN
INTRAMUSCULAR | Status: AC
  Filled 2017-03-25: qty 2

## 2017-03-25 MED ORDER — LACTATED RINGERS IV SOLN: INTRAVENOUS | Status: DC

## 2017-03-25 MED ORDER — PROPOFOL 10 MG/ML IV BOLUS
INTRAVENOUS | Status: AC
  Filled 2017-03-25: qty 20

## 2017-03-25 MED ORDER — SEVOFLURANE IN SOLN
RESPIRATORY_TRACT | Status: AC
  Filled 2017-03-25: qty 250

## 2017-03-25 SURGICAL SUPPLY — 31 items
BLADE SURG MINI STRL (BLADE) ×3 IMPLANT
BNDG ESMARK 4X12 TAN STRL LF (GAUZE/BANDAGES/DRESSINGS) ×3 IMPLANT
CANISTER SUCT 1200ML W/VALVE (MISCELLANEOUS) ×3 IMPLANT
CHLORAPREP W/TINT 26ML (MISCELLANEOUS) ×3 IMPLANT
CUFF TOURN 18 STER (MISCELLANEOUS) IMPLANT
ELECT REM PT RETURN 9FT ADLT (ELECTROSURGICAL) ×3
ELECTRODE REM PT RTRN 9FT ADLT (ELECTROSURGICAL) ×1 IMPLANT
GAUZE FLUFF 18X24 1PLY STRL (GAUZE/BANDAGES/DRESSINGS) ×3 IMPLANT
GAUZE PETRO XEROFOAM 1X8 (MISCELLANEOUS) ×3 IMPLANT
GAUZE SPONGE 4X4 12PLY STRL (GAUZE/BANDAGES/DRESSINGS) ×3 IMPLANT
GLOVE SURG ORTHO 8.0 STRL STRW (GLOVE) ×3 IMPLANT
GOWN STRL REUS W/ TWL LRG LVL3 (GOWN DISPOSABLE) ×1 IMPLANT
GOWN STRL REUS W/TWL LRG LVL3 (GOWN DISPOSABLE) ×3
GOWN STRL REUS W/TWL LRG LVL4 (GOWN DISPOSABLE) ×3 IMPLANT
KIT RM TURNOVER STRD PROC AR (KITS) IMPLANT
NS IRRIG 500ML POUR BTL (IV SOLUTION) ×3 IMPLANT
PACK EXTREMITY ARMC (MISCELLANEOUS) ×3 IMPLANT
PAD CAST CTTN 4X4 STRL (SOFTGOODS) ×1 IMPLANT
PADDING CAST COTTON 4X4 STRL (SOFTGOODS) ×3
SPLINT CAST 1 STEP 3X12 (MISCELLANEOUS) ×3 IMPLANT
STOCKINETTE 48X4 2 PLY STRL (GAUZE/BANDAGES/DRESSINGS) ×1 IMPLANT
STOCKINETTE BIAS CUT 4 980044 (GAUZE/BANDAGES/DRESSINGS) ×3 IMPLANT
STOCKINETTE STRL 4IN 9604848 (GAUZE/BANDAGES/DRESSINGS) ×3 IMPLANT
STRAP SAFETY BODY (MISCELLANEOUS) ×3 IMPLANT
SUT ETHILON 4-0 (SUTURE) ×3
SUT ETHILON 4-0 FS2 18XMFL BLK (SUTURE) ×1
SUT ETHILON 5-0 (SUTURE) ×3
SUT ETHILON 5-0 C-3 18XMFL BLK (SUTURE) ×1
SUT VIC AB 4-0 FS2 27 (SUTURE) ×3 IMPLANT
SUTURE ETHLN 4-0 FS2 18XMF BLK (SUTURE) ×1 IMPLANT
SUTURE ETHLN 5-0 C3 18XMF BLK (SUTURE) ×1 IMPLANT

## 2017-03-25 NOTE — Progress Notes (Signed)
Patient arrived to same day surgery. Advised we needed a urine specimen before we begin surgery due to history of drug use. Patient stated he had not had anything to drink since midnight and would absolutely not be able to void. Refused to go to restroom to try to obtain sample. Dr. Hyacinth MeekerMiller came in and stated if he would not be able to urinate then he is cancelled and advised patient to call the office. Patient concerned about money already paid by his father for the surgery, advised him to tell his dad to contact office or billing to get refund since he was unsure of where money was paid to. Patient left out of room walking to waiting room, upset with the situation.

## 2017-03-31 ENCOUNTER — Other Ambulatory Visit: Payer: Self-pay | Admitting: Specialist

## 2017-04-05 MED ORDER — CLINDAMYCIN PHOSPHATE 600 MG/50ML IV SOLN: 600.0000 mg | INTRAVENOUS | Status: DC

## 2017-04-05 MED ORDER — CEFAZOLIN SODIUM-DEXTROSE 2-4 GM/100ML-% IV SOLN: 2.0000 g | INTRAVENOUS | Status: DC

## 2017-04-06 ENCOUNTER — Encounter
Admission: RE | Disposition: A | Discharge status: Home / Self Care | Payer: Self-pay | Source: Ambulatory Visit | Attending: Specialist

## 2017-04-06 ENCOUNTER — Ambulatory Visit
Admission: RE | Admit: 2017-04-06 | Discharge: 2017-04-06 | Disposition: A | Discharge status: Home / Self Care | Payer: BLUE CROSS/BLUE SHIELD | Source: Ambulatory Visit | Attending: Specialist | Admitting: Specialist

## 2017-04-06 DIAGNOSIS — Z1881 Retained glass fragments: Secondary | ICD-10-CM | POA: Diagnosis not present

## 2017-04-06 DIAGNOSIS — Z5309 Procedure and treatment not carried out because of other contraindication: Secondary | ICD-10-CM | POA: Diagnosis not present

## 2017-04-06 SURGERY — REMOVAL FOREIGN BODY EXTREMITY
Anesthesia: Choice | Laterality: Right

## 2017-04-06 MED ORDER — MELOXICAM 7.5 MG PO TABS
ORAL_TABLET | ORAL | Status: AC
  Filled 2017-04-06: qty 2

## 2017-04-06 MED ORDER — MELOXICAM 7.5 MG PO TABS: 15.0000 mg | ORAL_TABLET | Freq: Once | ORAL | Status: DC

## 2017-04-06 MED ORDER — CEFAZOLIN SODIUM-DEXTROSE 2-4 GM/100ML-% IV SOLN
INTRAVENOUS | Status: AC
  Filled 2017-04-06: qty 100

## 2017-04-06 MED ORDER — GABAPENTIN 400 MG PO CAPS
ORAL_CAPSULE | ORAL | Status: DC
Start: 2017-04-06 — End: 2017-04-06
  Filled 2017-04-06: qty 1

## 2017-04-06 MED ORDER — GABAPENTIN 400 MG PO CAPS: 400.0000 mg | ORAL_CAPSULE | Freq: Once | ORAL | Status: DC

## 2017-04-06 MED ORDER — FAMOTIDINE 20 MG PO TABS
ORAL_TABLET | ORAL | Status: AC
  Filled 2017-04-06: qty 1

## 2017-04-06 MED ORDER — CHLORHEXIDINE GLUCONATE CLOTH 2 % EX PADS: 6.0000 | MEDICATED_PAD | Freq: Once | CUTANEOUS | Status: DC

## 2017-04-06 MED ORDER — CLINDAMYCIN PHOSPHATE 600 MG/50ML IV SOLN
INTRAVENOUS | Status: AC
  Filled 2017-04-06: qty 50

## 2017-04-06 MED ORDER — FAMOTIDINE 20 MG PO TABS: 20.0000 mg | ORAL_TABLET | Freq: Once | ORAL | Status: DC

## 2017-04-06 MED ORDER — LACTATED RINGERS IV SOLN: INTRAVENOUS | Status: DC

## 2017-04-06 SURGICAL SUPPLY — 31 items
BLADE SURG MINI STRL (BLADE) ×3 IMPLANT
BNDG ESMARK 4X12 TAN STRL LF (GAUZE/BANDAGES/DRESSINGS) ×3 IMPLANT
CANISTER SUCT 1200ML W/VALVE (MISCELLANEOUS) ×3 IMPLANT
CHLORAPREP W/TINT 26ML (MISCELLANEOUS) ×3 IMPLANT
CUFF TOURN 18 STER (MISCELLANEOUS) IMPLANT
ELECT REM PT RETURN 9FT ADLT (ELECTROSURGICAL) ×3
ELECTRODE REM PT RTRN 9FT ADLT (ELECTROSURGICAL) ×1 IMPLANT
GAUZE FLUFF 18X24 1PLY STRL (GAUZE/BANDAGES/DRESSINGS) ×3 IMPLANT
GAUZE PETRO XEROFOAM 1X8 (MISCELLANEOUS) ×3 IMPLANT
GAUZE SPONGE 4X4 12PLY STRL (GAUZE/BANDAGES/DRESSINGS) ×3 IMPLANT
GLOVE SURG ORTHO 8.0 STRL STRW (GLOVE) ×3 IMPLANT
GOWN STRL REUS W/ TWL LRG LVL3 (GOWN DISPOSABLE) ×1 IMPLANT
GOWN STRL REUS W/TWL LRG LVL3 (GOWN DISPOSABLE) ×3
GOWN STRL REUS W/TWL LRG LVL4 (GOWN DISPOSABLE) ×3 IMPLANT
KIT RM TURNOVER STRD PROC AR (KITS) IMPLANT
NS IRRIG 500ML POUR BTL (IV SOLUTION) ×3 IMPLANT
PACK EXTREMITY ARMC (MISCELLANEOUS) ×3 IMPLANT
PAD CAST CTTN 4X4 STRL (SOFTGOODS) ×1 IMPLANT
PADDING CAST COTTON 4X4 STRL (SOFTGOODS) ×3
SPLINT CAST 1 STEP 3X12 (MISCELLANEOUS) ×3 IMPLANT
STOCKINETTE 48X4 2 PLY STRL (GAUZE/BANDAGES/DRESSINGS) ×1 IMPLANT
STOCKINETTE BIAS CUT 4 980044 (GAUZE/BANDAGES/DRESSINGS) ×3 IMPLANT
STOCKINETTE STRL 4IN 9604848 (GAUZE/BANDAGES/DRESSINGS) ×3 IMPLANT
STRAP SAFETY BODY (MISCELLANEOUS) ×3 IMPLANT
SUT ETHILON 4-0 (SUTURE) ×3
SUT ETHILON 4-0 FS2 18XMFL BLK (SUTURE) ×1
SUT ETHILON 5-0 (SUTURE) ×3
SUT ETHILON 5-0 C-3 18XMFL BLK (SUTURE) ×1
SUT VIC AB 4-0 FS2 27 (SUTURE) ×3 IMPLANT
SUTURE ETHLN 4-0 FS2 18XMF BLK (SUTURE) ×1 IMPLANT
SUTURE ETHLN 5-0 C3 18XMF BLK (SUTURE) ×1 IMPLANT

## 2017-04-06 NOTE — OR Nursing (Signed)
Patient unable to void for UDS,  Patient reports eating candy at 1230 today.  Dr Hyacinth MeekerMIller canceled case, Dr Noralyn Pickcarroll in to see patient and discussed plan of care as well.  Patient instructed to call office to reschedule.  Patient wanted toto know if he could be refunded payment that he has paid to MD office.  Instructed patient to call office and discuss with them.

## 2017-11-25 ENCOUNTER — Emergency Department: Payer: BLUE CROSS/BLUE SHIELD

## 2017-11-25 ENCOUNTER — Emergency Department
Admission: EM | Admit: 2017-11-25 | Discharge: 2017-11-25 | Disposition: A | Discharge status: Home / Self Care | Payer: BLUE CROSS/BLUE SHIELD | Attending: Emergency Medicine | Admitting: Emergency Medicine

## 2017-11-25 ENCOUNTER — Encounter: Payer: Self-pay | Admitting: Emergency Medicine

## 2017-11-25 ENCOUNTER — Other Ambulatory Visit: Payer: Self-pay

## 2017-11-25 DIAGNOSIS — R109 Unspecified abdominal pain: Secondary | ICD-10-CM | POA: Insufficient documentation

## 2017-11-25 DIAGNOSIS — N50819 Testicular pain, unspecified: Secondary | ICD-10-CM

## 2017-11-25 DIAGNOSIS — N5082 Scrotal pain: Secondary | ICD-10-CM

## 2017-11-25 DIAGNOSIS — I861 Scrotal varices: Secondary | ICD-10-CM | POA: Insufficient documentation

## 2017-11-25 DIAGNOSIS — R3911 Hesitancy of micturition: Secondary | ICD-10-CM | POA: Insufficient documentation

## 2017-11-25 DIAGNOSIS — F17210 Nicotine dependence, cigarettes, uncomplicated: Secondary | ICD-10-CM | POA: Insufficient documentation

## 2017-11-25 DIAGNOSIS — F1110 Opioid abuse, uncomplicated: Secondary | ICD-10-CM | POA: Insufficient documentation

## 2017-11-25 LAB — BASIC METABOLIC PANEL
ANION GAP: 10 (ref 5–15)
BUN: 18 mg/dL (ref 6–20)
CHLORIDE: 97 mmol/L — AB (ref 101–111)
CO2: 30 mmol/L (ref 22–32)
CREATININE: 1.04 mg/dL (ref 0.61–1.24)
Calcium: 9.5 mg/dL (ref 8.9–10.3)
GFR calc non Af Amer: >60 mL/min (ref >60)
GLUCOSE: 127 mg/dL — AB (ref 65–99)
Potassium: 4 mmol/L (ref 3.5–5.1)
Sodium: 137 mmol/L (ref 135–145)

## 2017-11-25 LAB — CBC
HCT: 44.4 % (ref 40.0–52.0)
HEMOGLOBIN: 15.4 g/dL (ref 13.0–18.0)
MCH: 33.3 pg (ref 26.0–34.0)
MCHC: 34.6 g/dL (ref 32.0–36.0)
MCV: 96.2 fL (ref 80.0–100.0)
Platelets: 278 10*3/uL (ref 150–440)
RBC: 4.61 MIL/uL (ref 4.40–5.90)
RDW: 12 % (ref 11.5–14.5)
WBC: 7.5 10*3/uL (ref 3.8–10.6)

## 2017-11-25 LAB — LACTIC ACID, PLASMA: Lactic Acid, Venous: 0.6 mmol/L (ref 0.5–1.9)

## 2017-11-25 NOTE — ED Provider Notes (Signed)
[facility] Emergency Department Provider Note  ____________________________________________  Time seen: Approximately 2:36 PM  I have reviewed the triage vital signs and the nursing notes.   HISTORY  Chief Complaint Urinary Retention   HPI Raymond Cole is a 22 y.o. male with a history of heroin abuse who presents for evaluation of hesitancy and bilateral testicular pain. Patient reports several weeks of dull intermittent pain and bilateral testicle. The pain has become worse over the last few weeks. The pain has also become more constant over the last 3 days. Patient also has noted for several weeks that his been having difficulty initiating urination. He has normal urination and able to fully empty his bladder but reports that he has to stand for several minutes to be able to initiate urination. Over the last few days he is also complaining of lower sharpconstant abdominal pain currently 7/10. He reports that yesterday he woke up with bruising on bilateral lower abdominal regions. He does not remember hitting his abdomen anywhere. He denies any hematuria, nausea, vomiting, fever, prior abdominal surgeries, personal or family history of prostate cancer BPH,constipation or diarrhea. patient is IV drug abuser and last used heroin 3 days ago.  Past Medical History:  Diagnosis Date  . Anxiety    NO MEDS  . Pneumonia    H/O    Patient Active Problem List   Diagnosis Date Noted  . 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 Alcohol intoxication (HCC) 03/04/2017    Past Surgical History:  Procedure Laterality Date  . MOUTH SURGERY      Prior to Admission medications   Medication Sig Start Date End Date Taking? Authorizing Provider  calcium carbonate (TUMS - DOSED IN MG ELEMENTAL CALCIUM) 500 MG chewable tablet Chew 1 tablet by mouth as needed for indigestion or heartburn.    [provider]  ibuprofen (ADVIL,MOTRIN) 200 MG tablet Take  800 mg by mouth every 6 (six) hours as needed for fever, mild pain or moderate pain.    [provider]    Allergies Patient has no known allergies.  No family history on file.  Social History Social History   Tobacco Use  . Smoking status: Current Every Day Smoker    Packs/day: 1.00    Years: 5.00    Pack years: 5.00    Types: Cigarettes  . Smokeless tobacco: Never Used  Substance Use Topics  . Alcohol use: Yes    Comment: rare  . Drug use: Yes    Types: Marijuana, Cocaine, Benzodiazepines    Comment: +UDS ON 03-04-17 FOR COCAINE, MARIJUANA  AND BENZO'S    Review of Systems  Constitutional: Negative for fever. Eyes: Negative for visual changes. ENT: Negative for sore throat. Neck: No neck pain  Cardiovascular: Negative for chest pain. Respiratory: Negative for shortness of breath. Gastrointestinal: + lower abdominal pain. No vomiting or diarrhea. Genitourinary: + difficulty initiating urination, b/l testicular pain Musculoskeletal: Negative for back pain. Skin: Negative for rash. Neurological: Negative for headaches, weakness or numbness. Psych: No SI or HI  ____________________________________________   PHYSICAL EXAM:  VITAL SIGNS: ED Triage Vitals  Enc Vitals Group     BP 11/25/17 1225 100/75     Pulse Rate 11/25/17 1225 (!) 108     Resp 11/25/17 1225 18     Temp 11/25/17 1225 97.8 F (36.6 C)     Temp Source 11/25/17 1225 Oral     SpO2 11/25/17 1225 100 %     Weight  11/25/17 1227 170 lb (77.1 kg)     Height 11/25/17 1227 5\' 10"  (1.778 m)     Head Circumference --      Peak Flow --      Pain Score 11/25/17 1227 7     Pain Loc --      Pain Edu? --      Excl. in GC? --     Constitutional: Alert and oriented. Well appearing and in no apparent distress. HEENT:      Head: Normocephalic and atraumatic.         Eyes: Conjunctivae are normal. Sclera is non-icteric.       Mouth/Throat: Mucous membranes are moist.       Neck: Supple with no  signs of meningismus. Cardiovascular: Regular rate and rhythm. No murmurs, gallops, or rubs. 2+ symmetrical distal pulses are present in all extremities. No JVD. Respiratory: Normal respiratory effort. Lungs are clear to auscultation bilaterally. No wheezes, crackles, or rhonchi.  Gastrointestinal: Soft, mild ttp over the lower quadrants, and non distended with positive bowel sounds. No rebound or guarding. Patient has small bilateral bruises on his abdomen right above the iliac crest region which look a few days old. Genitourinary: No CVA tenderness. Bilateral testicles are descended with no tenderness to palpation, bilateral positive cremasteric reflexes are present, no swelling or erythema of the scrotum. No evidence of inguinal hernia. Musculoskeletal: Nontender with normal range of motion in all extremities. No edema, cyanosis, or erythema of extremities. Neurologic: Normal speech and language. Face is symmetric. Moving all extremities. No gross focal neurologic deficits are appreciated. Skin: Skin is warm, dry and intact. No rash noted. Psychiatric: Mood and affect are normal. Speech and behavior are normal.  ____________________________________________   LABS (all labs ordered are listed, but only abnormal results are displayed)  Labs Reviewed  BASIC METABOLIC PANEL - Abnormal; Notable for the following components:      Result Value   Chloride 97 (*)    Glucose, Bld 127 (*)    All other components within normal limits  CHLAMYDIA/NGC RT PCR ([facility] ONLY)  CBC  LACTIC ACID, PLASMA  URINALYSIS, COMPLETE (UACMP) WITH MICROSCOPIC  LACTIC ACID, PLASMA   ____________________________________________  EKG  none ____________________________________________  RADIOLOGY  I have personally reviewed the images performed during this visit and I agree with the Radiologist's read.   Interpretation by Radiologist:  US Scrotum  Result Date: 11/25/2017 CLINICAL DATA:  One month  history of scrotal pain. EXAM: SCROTAL ULTRASOUND DOPPLER ULTRASOUND OF THE TESTICLES TECHNIQUE: Complete ultrasound examination of the testicles, epididymis, and other scrotal structures was performed. Color and spectral Doppler ultrasound were also utilized to evaluate blood flow to the testicles. COMPARISON:  None. FINDINGS: Right testicle Measurements: 4.4 x 2.1 x 2.8 cm. Symmetric and homogeneous echotexture without focal lesion. Patent arterial and venous blood flow. Left testicle Measurements: 4.4 x 2.0 x 2.7 cm. Symmetric and homogeneous echotexture without focal lesion. Patent arterial and venous blood flow. Right epididymis:  Normal in size and appearance. Left epididymis:  Normal in size and appearance. Hydrocele:  None visualized. Varicocele:  Bilateral varicoceles are demonstrated Pulsed Doppler interrogation of both testes demonstrates normal low resistance arterial and venous waveforms bilaterally. IMPRESSION: 1. Normal sonographic appearance of both testicles. 2. Bilateral varicoceles. Electronically Signed   By: Rudie Meyer M.D.   On: 11/25/2017 13:32   US Scrotum Doppler  Result Date: 11/25/2017 CLINICAL DATA:  One month history of scrotal pain. EXAM: SCROTAL ULTRASOUND DOPPLER ULTRASOUND OF THE  TESTICLES TECHNIQUE: Complete ultrasound examination of the testicles, epididymis, and other scrotal structures was performed. Color and spectral Doppler ultrasound were also utilized to evaluate blood flow to the testicles. COMPARISON:  None. FINDINGS: Right testicle Measurements: 4.4 x 2.1 x 2.8 cm. Symmetric and homogeneous echotexture without focal lesion. Patent arterial and venous blood flow. Left testicle Measurements: 4.4 x 2.0 x 2.7 cm. Symmetric and homogeneous echotexture without focal lesion. Patent arterial and venous blood flow. Right epididymis:  Normal in size and appearance. Left epididymis:  Normal in size and appearance. Hydrocele:  None visualized. Varicocele:  Bilateral varicoceles  are demonstrated Pulsed Doppler interrogation of both testes demonstrates normal low resistance arterial and venous waveforms bilaterally. IMPRESSION: 1. Normal sonographic appearance of both testicles. 2. Bilateral varicoceles. Electronically Signed   By: Rudie MeyerP.  Gallerani M.D.   On: 11/25/2017 13:32     ____________________________________________   PROCEDURES  Procedure(s) performed: None Procedures Critical Care performed:  None ____________________________________________   INITIAL IMPRESSION / ASSESSMENT AND PLAN / ED COURSE  22 y.o. male with a history of heroin abuse who presents for evaluation of hesitancy and bilateral testicular pain and lower abdominal pain. patient is well-appearing, in no distress, normal vital signs, slightly tachycardic but afebrile. Abdomen is soft with very mild tenderness on the lower quadrants, no rebound or guarding, there are 2 small bruises on the lateral aspect of his abdomen bilaterally which looks a few days old. GU exam is within normal limits. Ultrasound of the scrotum shows bilateral varicoceles and no evidence of torsion, orchitis, or epididymitis. Labs including CBC and BMP are within normal limits. Recommended STD testing to rule out GC or chlamydia as possible causes of patient's symptoms. Recommended CT scan for further evaluation of lower abdominal pain especially with bruising although I do believe the bruising was probably from external trauma. I explained to the patient the CT will help us rule out other causes of abdominal pain such as appendicitis, colitis, diverticulitis. Patient refuses urine testing because he said he urinated in the waiting room and probably will take him several hours to be able to urinate again. I offered PO and IV hydration for urine evaluation but patient refused. Patient also refuses CT and reports not having insurance and money to pay for it. Patient understands my recommendations and the risk of leaving without finishing  his workup including untreated STDs, or a intra-abdominal infection/bleeding that if discovered today could be treated with antibiotics, surgery, admission to the hospital. [facility] further evaluation of his hesitancy.      As part of my medical decision making, I reviewed the following data within the electronic MEDICAL RECORD NUMBER Nursing notes reviewed and incorporated, Labs reviewed , Radiograph reviewed , Notes from prior ED visits and [place] Controlled Substance Database    Pertinent labs & imaging results that were available during my care of the patient were reviewed by me and considered in my medical decision making (see chart for details).    ____________________________________________   FINAL CLINICAL IMPRESSION(S) / ED DIAGNOSES  Final diagnoses:  Hesitancy of micturition  Testicular pain  Abdominal pain, unspecified abdominal location  Bilateral varicoceles      NEW MEDICATIONS STARTED DURING THIS VISIT:  ED Discharge Orders    None       Note:  This document was prepared using Dragon voice recognition software and may include unintentional dictation errors.    Don PerkingVeronese, [place], MD 11/25/17 [phone]

## 2017-11-25 NOTE — ED Notes (Signed)
Bladder scan preformed. of urine in pt bladder.

## 2017-11-25 NOTE — Discharge Instructions (Signed)
You have been seen in the Emergency Department (ED) for abdominal pain.  Your evaluation did not identify a clear cause of your symptoms but was generally reassuring. However you did undergo the recommended evaluation.  Abdominal pain has many possible causes. Some aren't serious and get better on their own in a few days. Others need more testing and treatment. If your pain continues or gets worse, you need to be rechecked and may need more tests to find out what is wrong. You may need surgery to correct the problem.   Follow up with your doctor in 12-24 hours if you are still having abdominal pain. Otherwise follow up in 1-3 days for a re-check  Don't ignore new symptoms, such as fever, nausea and vomiting, new or worsening abdominal pain, urination problems, bloody diarrhea or bloody stools, black tarry stools, uncontrollable nausea and vomiting, and dizziness. These may be signs of a more serious problem. If you develop any of these you should be seen by your doctor immediately or return to the ED.   How can you care for yourself at home?  Rest until you feel better.  To prevent dehydration, drink plenty of fluids, enough so that your urine is light yellow or clear like water. Choose water and other caffeine-free clear liquids until you feel better. If you have kidney, heart, or liver disease and have to limit fluids, talk with your doctor before you increase the amount of fluids you drink.  If your stomach is upset, eat mild foods, such as rice, dry toast or crackers, bananas, and applesauce. Try eating several small meals instead of two or three large ones.  Wait until 48 hours after all symptoms have gone away before you have spicy foods, alcohol, and drinks that contain caffeine.  Do not eat foods that are high in fat.  Avoid anti-inflammatory medicines such as aspirin, ibuprofen (Advil, Motrin), and naproxen (Aleve). These can cause stomach upset. Talk to your doctor if you take daily aspirin  for another health problem.  When should you call for help?  Call 911 anytime you think you may need emergency care. For example, call if:  You passed out (lost consciousness).  You pass maroon or very bloody stools.  You vomit blood or what looks like coffee grounds.  You have new, severe belly pain.  Call your doctor now or seek immediate medical care if:  Your pain gets worse, especially if it becomes focused in one area of your belly.  You have a new or higher fever.  Your stools are black and look like tar, or they have streaks of blood.  You have unexpected vaginal bleeding.  You have symptoms of a urinary tract infection. These may include:  Pain when you urinate.  Urinating more often than usual.  Blood in your urine. You are dizzy or lightheaded, or you feel like you may faint. Watch closely for changes in your health, and be sure to contact your doctor if:  You are not getting better after 1 day (24 hours).

## 2017-11-25 NOTE — ED Triage Notes (Signed)
Pt states he has been having issues with urinary retention for a few months now, states last week he went 3 days without peeing, denies blood in urine, states "my pain in my balls are unbearable." Bilateral bruising to lower abdomen.  States he will have heroin in his system when we draw blood.  Pt very diaphoretic in triage.

## 2017-11-25 NOTE — ED Notes (Signed)
Pt refused to wait to give urine sample and to be bladder scanned. Asked to be discharged now. Dr Don PerkingVeronese notified. Pt discharged.

## 2018-02-02 ENCOUNTER — Other Ambulatory Visit: Payer: Self-pay | Admitting: Gastroenterology

## 2018-02-02 DIAGNOSIS — B1920 Unspecified viral hepatitis C without hepatic coma: Secondary | ICD-10-CM

## 2018-02-05 ENCOUNTER — Ambulatory Visit
Admission: RE | Admit: 2018-02-05 | Discharge: 2018-02-05 | Disposition: A | Discharge status: Home / Self Care | Payer: BLUE CROSS/BLUE SHIELD | Source: Ambulatory Visit | Attending: Gastroenterology | Admitting: Gastroenterology

## 2018-02-05 DIAGNOSIS — B1920 Unspecified viral hepatitis C without hepatic coma: Secondary | ICD-10-CM

## 2018-06-05 ENCOUNTER — Other Ambulatory Visit: Payer: Self-pay

## 2018-06-05 ENCOUNTER — Encounter: Payer: Self-pay | Admitting: Emergency Medicine

## 2018-06-05 ENCOUNTER — Emergency Department
Admission: EM | Admit: 2018-06-05 | Discharge: 2018-06-05 | Disposition: A | Discharge status: Home / Self Care | Payer: BLUE CROSS/BLUE SHIELD | Attending: Emergency Medicine | Admitting: Emergency Medicine

## 2018-06-05 DIAGNOSIS — F1010 Alcohol abuse, uncomplicated: Secondary | ICD-10-CM | POA: Insufficient documentation

## 2018-06-05 DIAGNOSIS — Z5321 Procedure and treatment not carried out due to patient leaving prior to being seen by health care provider: Secondary | ICD-10-CM | POA: Insufficient documentation

## 2018-06-05 NOTE — ED Triage Notes (Signed)
Patient state that he has been drinking. States that he is not suicidal. States he does want help but will not state what he wants help with. Dad states he had made statements of wanting to harm self however he denies this. Appears intoxicated.

## 2018-06-05 NOTE — ED Triage Notes (Signed)
Patient during triage continues to deny SI. Continues to deny wanting help with drug problem. Ambulates out of triage. Brother and father with patient.

## 2018-06-07 ENCOUNTER — Encounter: Payer: Self-pay | Admitting: Emergency Medicine

## 2018-06-22 ENCOUNTER — Emergency Department
Admission: EM | Admit: 2018-06-22 | Discharge: 2018-06-22 | Discharge status: Against Medical Advice | Payer: BLUE CROSS/BLUE SHIELD | Attending: Emergency Medicine | Admitting: Emergency Medicine

## 2018-06-22 ENCOUNTER — Encounter: Payer: Self-pay | Admitting: Emergency Medicine

## 2018-06-22 DIAGNOSIS — F17210 Nicotine dependence, cigarettes, uncomplicated: Secondary | ICD-10-CM | POA: Insufficient documentation

## 2018-06-22 DIAGNOSIS — Z79899 Other long term (current) drug therapy: Secondary | ICD-10-CM | POA: Insufficient documentation

## 2018-06-22 DIAGNOSIS — G44311 Acute post-traumatic headache, intractable: Secondary | ICD-10-CM | POA: Diagnosis not present

## 2018-06-22 DIAGNOSIS — R51 Headache: Secondary | ICD-10-CM | POA: Diagnosis present

## 2018-06-22 NOTE — Discharge Instructions (Signed)
Return if you would like to be further evaluated

## 2018-06-22 NOTE — ED Triage Notes (Signed)
PT arrived with complaints of persistent headache for the last two weeks. Pt states the pain is in the back of his head. PT states he hit his head right before the pain started. No loc, no neuro deficits

## 2018-07-08 NOTE — ED Provider Notes (Signed)
[facility] Emergency Department Provider Note  ____________________________________________  Time seen: Approximately 1:18 PM  I have reviewed the triage vital signs and the nursing notes.   HISTORY  Chief Complaint Headache   HPI Lahoma RockerBrandon D Streety is a 22 y.o. male who initially presented to the ER for evaluation of headache. He now wants to leave before exam. He declines to provide a reason.   Past Medical History:  Diagnosis Date  . Anxiety    NO MEDS  . Pneumonia    H/O    Patient Active Problem List   Diagnosis Date Noted  . 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 Alcohol intoxication (HCC) 03/04/2017    Past Surgical History:  Procedure Laterality Date  . MOUTH SURGERY      Prior to Admission medications   Medication Sig Start Date End Date Taking? Authorizing Provider  calcium carbonate (TUMS - DOSED IN MG ELEMENTAL CALCIUM) 500 MG chewable tablet Chew 1 tablet by mouth as needed for indigestion or heartburn.    [provider]  ibuprofen (ADVIL,MOTRIN) 200 MG tablet Take 800 mg by mouth every 6 (six) hours as needed for fever, mild pain or moderate pain.    [provider]    Allergies Patient has no known allergies.  No family history on file.  Social History Social History   Tobacco Use  . Smoking status: Current Every Day Smoker    Packs/day: 1.00    Years: 5.00    Pack years: 5.00    Types: Cigarettes  Substance Use Topics  . Alcohol use: Yes    Comment: rare  . Drug use: Yes    Types: Marijuana, Cocaine, Benzodiazepines    Comment: +UDS ON 03-04-17 FOR COCAINE, MARIJUANA  AND BENZO'S    Review of Systems Patient refuses exam ___________________   PHYSICAL EXAM:  VITAL SIGNS: ED Triage Vitals  Enc Vitals Group     BP 06/22/18 1724 (!) 163/105     Pulse Rate 06/22/18 1724 (!) 106     Resp 06/22/18 1724 18     Temp 06/22/18 1724 98.6 F (37 C)     Temp Source 06/22/18 1724 Oral      SpO2 06/22/18 1724 98 %     Weight 06/22/18 1729 145 lb (65.8 kg)     Height 06/22/18 1729 5\' 10"  (1.778 m)     Head Circumference --      Peak Flow --      Pain Score 06/22/18 1729 10     Pain Loc --      Pain Edu? --      Excl. in GC? --     Constitutional: Alert and oriented. Well appearing and in no acute distress. Neurologic:  Normal speech and language.Speech is normal. No gait instability. Psychiatric: Mood and affect are normal. Speech and behavior are normal.  ____________________________________________   LABS (all labs ordered are listed, but only abnormal results are displayed)  Labs Reviewed - No data to display ____________________________________________  EKG   ____________________________________________  RADIOLOGY   ____________________________________________   PROCEDURES   ____________________________________________   INITIAL IMPRESSION / ASSESSMENT AND PLAN / ED COURSE     Pertinent labs & imaging results that were available during my care of the patient were reviewed by me and considered in my medical decision making (see chart for details).  Patient was advised to return to the ER if he chose to be evaluated. ____________________________________________   FINAL CLINICAL IMPRESSION(S) /  ED DIAGNOSES  Final diagnoses:  Intractable acute post-traumatic headache       Chinita Pester, FNP 07/08/18 1325    Jene Every, MD 07/09/18 2107

## 2018-08-28 ENCOUNTER — Emergency Department
Admission: EM | Admit: 2018-08-28 | Discharge: 2018-08-28 | Disposition: A | Discharge status: Home / Self Care | Payer: BLUE CROSS/BLUE SHIELD | Attending: Emergency Medicine | Admitting: Emergency Medicine

## 2018-08-28 ENCOUNTER — Other Ambulatory Visit: Payer: Self-pay

## 2018-08-28 DIAGNOSIS — N4889 Other specified disorders of penis: Secondary | ICD-10-CM

## 2018-08-28 DIAGNOSIS — F17210 Nicotine dependence, cigarettes, uncomplicated: Secondary | ICD-10-CM | POA: Insufficient documentation

## 2018-08-28 LAB — URINALYSIS, ROUTINE W REFLEX MICROSCOPIC
Bilirubin Urine: NEGATIVE
Glucose, UA: NEGATIVE mg/dL
HGB URINE DIPSTICK: NEGATIVE
KETONES UR: NEGATIVE mg/dL
Leukocytes, UA: NEGATIVE
Nitrite: NEGATIVE
PH: 7 (ref 5.0–8.0)
PROTEIN: NEGATIVE mg/dL
Specific Gravity, Urine: 1.013 (ref 1.005–1.030)

## 2018-08-28 LAB — CHLAMYDIA/NGC RT PCR (ARMC ONLY)
CHLAMYDIA TR: NOT DETECTED
N GONORRHOEAE: NOT DETECTED

## 2018-08-28 NOTE — ED Notes (Signed)
RN unable to find pt for room.

## 2018-08-28 NOTE — ED Triage Notes (Signed)
Pt arrived via POV with reports of raised area on the left side of the penis and enlarged vein that he noticed on the left side of his penis around 0900.   Pt states the area is uncomfortable but not painful, rates discomfort 4/10.    Pt reports normal urination.   Pt reports having unprotected sex for 3 hours last night. Unsure of any swelling to testicles.

## 2018-08-28 NOTE — Discharge Instructions (Addendum)
You have been diagnosed with penile swelling.  This is likely due to a ruptured blood vessel.  At this point, no intervention is needed.  The swelling will improve over time.  If you notice an increase in swelling, pain or inability to urinate, please seek emergency care.

## 2018-08-28 NOTE — ED Provider Notes (Addendum)
[facility] Emergency Department Provider Note ____________________________________________  Time seen: 1505  I have reviewed the triage vital signs and the nursing notes.  HISTORY  Chief Complaint  Penis Pain   HPI Raymond Cole is a 23 y.o. male presents to the ER today with complaint of penile swelling.  He reports he noticed that this morning at 9 AM.  He reports the swelling is uncomfortable but is not painful.  He has not noticed any redness, warmth or bruising to the area.  He has not noticed any lesions or ulcerations.  He denies penile discharge or testicular pain or swelling.  He is urinating only.  He reports he did have unprotected sex for 3 hours last night, but denies any trauma during this session.  He reports the swelling and pain have dramatically improved since he presented to the ER.  He did not take anything prior to arrival.  Past Medical History:  Diagnosis Date  . Anxiety    NO MEDS  . Pneumonia    H/O    Patient Active Problem List   Diagnosis Date Noted  . 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 Alcohol intoxication (HCC) 03/04/2017    Past Surgical History:  Procedure Laterality Date  . MOUTH SURGERY      Prior to Admission medications   Medication Sig Start Date End Date Taking? Authorizing Provider  calcium carbonate (TUMS - DOSED IN MG ELEMENTAL CALCIUM) 500 MG chewable tablet Chew 1 tablet by mouth as needed for indigestion or heartburn.    [provider]  ibuprofen (ADVIL,MOTRIN) 200 MG tablet Take 800 mg by mouth every 6 (six) hours as needed for fever, mild pain or moderate pain.    [provider]    Allergies Patient has no known allergies.  No family history on file.  Social History Social History   Tobacco Use  . Smoking status: Current Every Day Smoker    Packs/day: 1.00    Years: 5.00    Pack years: 5.00    Types: Cigarettes  Substance Use Topics  . Alcohol use: Yes     Comment: rare  . Drug use: Yes    Types: Marijuana, Cocaine, Benzodiazepines    Comment: +UDS ON 03-04-17 FOR COCAINE, MARIJUANA  AND BENZO'S    Review of Systems  Constitutional: Negative for fever, chills or body aches. Cardiovascular: Negative for chest pain. Respiratory: Negative for shortness of breath. Gastrointestinal: Negative for abdominal pain. Genitourinary: Positive for penile pain.  Negative for fatigue, frequency or dysuria.  He could have for penile discharge or testicular pain and swelling. Skin: Negative for rash, lesion or ulceration.  ____________________________________________  PHYSICAL EXAM:  VITAL SIGNS: ED Triage Vitals  Enc Vitals Group     BP 08/28/18 1437 (!) 141/93     Pulse Rate 08/28/18 1437 (!) 108     Resp 08/28/18 1437 18     Temp 08/28/18 1437 98.3 F (36.8 C)     Temp Source 08/28/18 1437 Oral     SpO2 08/28/18 1437 95 %     Weight 08/28/18 1436 170 lb (77.1 kg)     Height 08/28/18 1436 5\' 10"  (1.778 m)     Head Circumference --      Peak Flow --      Pain Score 08/28/18 1434 4     Pain Loc --      Pain Edu? --      Excl. in GC? --  Constitutional: Alert and oriented. Well appearing and in no distress. Cardiovascular: Normal rate, regular rhythm.  Respiratory: Normal respiratory effort. No wheezes/rales/rhonchi. Gastrointestinal: Soft and nontender. No distention. GU: Normal male anatomy. No penile discharge noted. Mild swelling of the distal shaft but nontender with palpations. Testicles without mass, swelling or redeness. Neurologic:  Normal speech and language. No gross focal neurologic deficits are appreciated. Skin:  Skin is warm, dry and intact. No rash, lesions or ulcerations noted.  ____________________________________________   LABS Urinalysis    Component Value Date/Time   COLORURINE YELLOW (A) 08/28/2018 1438   APPEARANCEUR CLOUDY (A) 08/28/2018 1438   LABSPEC 1.013 08/28/2018 1438   PHURINE 7.0 08/28/2018 1438    GLUCOSEU NEGATIVE 08/28/2018 1438   HGBUR NEGATIVE 08/28/2018 1438   BILIRUBINUR NEGATIVE 08/28/2018 1438   KETONESUR NEGATIVE 08/28/2018 1438   PROTEINUR NEGATIVE 08/28/2018 1438   NITRITE NEGATIVE 08/28/2018 1438   LEUKOCYTESUR NEGATIVE 08/28/2018 1438     ____________________________________________   INITIAL IMPRESSION / ASSESSMENT AND PLAN / ED COURSE  Penile Swelling:  DDX Traumatic Hematoma of Penile Shaft, Acute Cystitis without Hematuria, STD, Penile Swelling:  Urinalysis normal Gonorrhea and Chlamydia- pt did not want to wait on results, advised we would contact him if the results were positive Swelling improved and no pain at this time. He is urinating normal Will monitor for now Discussed if increased swelling, pain or inability to urinate, seek emergency care ____________________________________________  FINAL CLINICAL IMPRESSION(S) / ED DIAGNOSES  Final diagnoses:  Penile swelling   Nicki Reaper, NP    Lorre Munroe, NP 08/28/18 1552    Lorre Munroe, NP 08/28/18 1553    Emily Filbert, MD 08/28/18 2332

## 2018-08-28 NOTE — ED Notes (Signed)
Discussed with Dr. Lenard Lance, no further orders besides urine sample at this time.

## 2018-08-28 NOTE — ED Notes (Signed)
RN at bedside to discharge pt. Per girlfriend pt went outside.

## 2018-10-06 ENCOUNTER — Other Ambulatory Visit: Payer: Self-pay

## 2018-10-06 ENCOUNTER — Inpatient Hospital Stay
Admission: EM | Admit: 2018-10-06 | Discharge: 2018-10-08 | DRG: 917 | Disposition: A | Discharge status: Home / Self Care | Payer: BLUE CROSS/BLUE SHIELD | Attending: Internal Medicine | Admitting: Internal Medicine

## 2018-10-06 ENCOUNTER — Encounter: Payer: Self-pay | Admitting: *Deleted

## 2018-10-06 DIAGNOSIS — F1190 Opioid use, unspecified, uncomplicated: Secondary | ICD-10-CM | POA: Diagnosis present

## 2018-10-06 DIAGNOSIS — F1910 Other psychoactive substance abuse, uncomplicated: Secondary | ICD-10-CM | POA: Diagnosis present

## 2018-10-06 DIAGNOSIS — J9601 Acute respiratory failure with hypoxia: Secondary | ICD-10-CM | POA: Diagnosis present

## 2018-10-06 DIAGNOSIS — I248 Other forms of acute ischemic heart disease: Secondary | ICD-10-CM | POA: Diagnosis present

## 2018-10-06 DIAGNOSIS — J690 Pneumonitis due to inhalation of food and vomit: Secondary | ICD-10-CM | POA: Diagnosis present

## 2018-10-06 DIAGNOSIS — J9600 Acute respiratory failure, unspecified whether with hypoxia or hypercapnia: Secondary | ICD-10-CM

## 2018-10-06 DIAGNOSIS — F17210 Nicotine dependence, cigarettes, uncomplicated: Secondary | ICD-10-CM | POA: Diagnosis present

## 2018-10-06 DIAGNOSIS — Z791 Long term (current) use of non-steroidal anti-inflammatories (NSAID): Secondary | ICD-10-CM

## 2018-10-06 DIAGNOSIS — G92 Toxic encephalopathy: Secondary | ICD-10-CM | POA: Diagnosis present

## 2018-10-06 DIAGNOSIS — T50901A Poisoning by unspecified drugs, medicaments and biological substances, accidental (unintentional), initial encounter: Secondary | ICD-10-CM | POA: Diagnosis present

## 2018-10-06 DIAGNOSIS — A419 Sepsis, unspecified organism: Secondary | ICD-10-CM | POA: Diagnosis not present

## 2018-10-06 DIAGNOSIS — R230 Cyanosis: Secondary | ICD-10-CM | POA: Diagnosis present

## 2018-10-06 DIAGNOSIS — F419 Anxiety disorder, unspecified: Secondary | ICD-10-CM | POA: Diagnosis present

## 2018-10-06 DIAGNOSIS — T401X1A Poisoning by heroin, accidental (unintentional), initial encounter: Principal | ICD-10-CM | POA: Diagnosis present

## 2018-10-06 DIAGNOSIS — Z79899 Other long term (current) drug therapy: Secondary | ICD-10-CM

## 2018-10-06 MED ORDER — SODIUM CHLORIDE 0.9 % IV BOLUS
1000.0000 mL | Freq: Once | INTRAVENOUS | Status: AC
Start: 2018-10-06 — End: 2018-10-07
  Administered 2018-10-06: 1000 mL via INTRAVENOUS

## 2018-10-06 MED ORDER — ONDANSETRON HCL 4 MG/2ML IJ SOLN
4.0000 mg | Freq: Once | INTRAMUSCULAR | Status: AC
  Administered 2018-10-06: 4 mg via INTRAVENOUS

## 2018-10-06 MED ORDER — NALOXONE HCL 2 MG/2ML IJ SOSY
1.0000 mg | PREFILLED_SYRINGE | Freq: Once | INTRAMUSCULAR | Status: AC
  Administered 2018-10-06: 1 mg via INTRAVENOUS

## 2018-10-06 NOTE — ED Notes (Signed)
MD at bedside. 

## 2018-10-06 NOTE — ED Provider Notes (Signed)
[facility] Emergency Department Provider Note    First MD Initiated Contact with Patient 10/06/18 2326     (approximate)  I have reviewed the triage vital signs and the nursing notes.   HISTORY  Chief Complaint Drug Overdose    HPI Raymond Cole is a 23 y.o. male with below list of chronic medical conditions presents to the emergency department via EMS following accidental heroin overdose.  EMS states that on arrival patient was cyanotic and bradypnea however after receiving 2 mg of Narcan patient had return of mental status and improvement of respiratory rate.  Patient does admit to using IV heroin today.  Patient also admits to heavy EtOH daily however states that he only drank a little today".   Past Medical History:  Diagnosis Date  . Anxiety    NO MEDS  . Pneumonia    H/O    Patient Active Problem List   Diagnosis Date Noted  . Drug overdose 10/07/2018  . 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 Alcohol intoxication (HCC) 03/04/2017    Past Surgical History:  Procedure Laterality Date  . MOUTH SURGERY      Prior to Admission medications   Medication Sig Start Date End Date Taking? Authorizing Provider  calcium carbonate (TUMS - DOSED IN MG ELEMENTAL CALCIUM) 500 MG chewable tablet Chew 1 tablet by mouth as needed for indigestion or heartburn.    [provider]  ibuprofen (ADVIL,MOTRIN) 200 MG tablet Take 800 mg by mouth every 6 (six) hours as needed for fever, mild pain or moderate pain.    [provider]    Allergies Patient has no known allergies.  History reviewed. No pertinent family history.  Social History Social History   Tobacco Use  . Smoking status: Current Every Day Smoker    Packs/day: 1.00    Years: 5.00    Pack years: 5.00    Types: Cigarettes  . Smokeless tobacco: Never Used  Substance Use Topics  . Alcohol use: Yes    Comment: rare  . Drug use: Yes    Types: Marijuana,  Cocaine, Benzodiazepines    Comment: +UDS ON 03-04-17 FOR COCAINE, MARIJUANA  AND BENZO'S    Review of Systems Constitutional: No fever/chills Eyes: No visual changes. ENT: No sore throat. Cardiovascular: Denies chest pain. Respiratory: Denies shortness of breath. Gastrointestinal: No abdominal pain.  No nausea, no vomiting.  No diarrhea.  No constipation. Genitourinary: Negative for dysuria. Musculoskeletal: Negative for neck pain.  Negative for back pain. Integumentary: Negative for rash. Neurological: Negative for headaches, focal weakness or numbness. Psychiatric:Positive for accidental heroin overdose.   ____________________________________________   PHYSICAL EXAM:  VITAL SIGNS: ED Triage Vitals [10/06/18 2324]  Enc Vitals Group     BP (!) 148/92     Pulse Rate (!) 106     Resp 13     Temp      Temp Source Oral     SpO2 (!) 86 %     Weight      Height      Head Circumference      Peak Flow      Pain Score      Pain Loc      Pain Edu?      Excl. in GC?     Constitutional: Alert and oriented.  Somnolent but arousable to verbal stimuli  eyes: Conjunctivae are normal. Mouth/Throat: Mucous membranes are moist. Oropharynx non-erythematous. Neck: No stridor.  Cardiovascular: Tachycardia, regular rhythm.  Good peripheral circulation. Grossly normal heart sounds. Respiratory: Normal respiratory effort.  No retractions. Lungs CTAB. Gastrointestinal: Soft and nontender. No distention.  Musculoskeletal: No lower extremity tenderness nor edema. No gross deformities of extremities. Neurologic:  Normal speech and language. No gross focal neurologic deficits are appreciated.  Skin:  Skin is warm, dry and intact. No rash noted. Psychiatric: Mood and affect are normal. Speech and behavior are normal.  ____________________________________________   LABS (all labs ordered are listed, but only abnormal results are displayed)  Labs Reviewed  URINE DRUG SCREEN, QUALITATIVE  ([facility] ONLY) - Abnormal; Notable for the following components:      Result Value   Amphetamines, Ur Screen POSITIVE (*)    Opiate, Ur Screen POSITIVE (*)    All other components within normal limits  CBC - Abnormal; Notable for the following components:   MCV 101.2 (*)    RDW 11.3 (*)    All other components within normal limits  COMPREHENSIVE METABOLIC PANEL - Abnormal; Notable for the following components:   Glucose, Bld 116 (*)    Calcium 8.7 (*)    AST 61 (*)    ALT 48 (*)    All other components within normal limits  ETHANOL  CK   ____________________________________________  EKG  ED ECG REPORT I, Melvin N BROWN, the attending physician, personally viewed and interpreted this ECG.   Date: 10/06/2018  EKG Time: 20 5 PM  Rate: 109  Rhythm: Sinus tachycardia  Axis: Normal  Intervals: Normal  ST&T Change: None  ____________________________________________  Critical Care performed:   .Critical Care Performed by: Darci Current, MD Authorized by: Darci Current, MD   Critical care provider statement:    Critical care time (minutes):  60   Critical care time was exclusive of:  Separately billable procedures and treating other patients (Heroin overdose)   Critical care was necessary to treat or prevent imminent or life-threatening deterioration of the following conditions:  Respiratory failure   Critical care was time spent personally by me on the following activities:  Development of treatment plan with patient or surrogate, discussions with consultants, evaluation of patient's response to treatment, examination of patient, obtaining history from patient or surrogate, ordering and performing treatments and interventions, ordering and review of laboratory studies, ordering and review of radiographic studies, pulse oximetry, re-evaluation of patient's condition and review of old charts   I assumed direction of critical care for this patient from another provider in my  specialty: no       ____________________________________________   INITIAL IMPRESSION / ASSESSMENT AND PLAN / ED COURSE  As part of my medical decision making, I reviewed the following data within the electronic MEDICAL RECORD NUMBER  23 year old male presenting with above-stated history and physical exam secondary to opiate overdose.  Patient required additional doses of Narcan in the emergency department secondary to hypoxia and bradypnea.  Despite additional boluses of Narcan patient's hypoxic episodes persisted and as such patient placed on a Narcan infusion.  Patient also noted to be markedly tachycardic and patient states that he drinks heavily daily but did not drink as much as usual today therefore also concerned about the possibility of EtOH withdrawal as well.  As such patient given Ativan 1 mg IV patient discussed with Dr. Katheren Shams for hospital admission further evaluation and management. ____________________________________________  FINAL CLINICAL IMPRESSION(S) / ED DIAGNOSES  Final diagnoses:  Accidental overdose of heroin, initial encounter [facility])     MEDICATIONS GIVEN DURING THIS VISIT:  Medications  naloxone (NARCAN) 4 mg in dextrose 5 % 250 mL infusion (0 mg/hr Intravenous Paused 10/07/18 0232)  LORazepam (ATIVAN) injection 1 mg (has no administration in time range)  ondansetron (ZOFRAN) injection 4 mg (4 mg Intravenous Given 10/06/18 2331)  naloxone [facility](NARCAN) injection 1 mg (1 mg Intravenous Given 10/06/18 2331)  sodium chloride 0.9 % bolus 1,000 mL (0 mLs Intravenous Stopped 10/07/18 0055)  LORazepam (ATIVAN) injection 0.5 mg (0.5 mg Intravenous Given 10/07/18 0220)     ED Discharge Orders    None       Note:  This document was prepared using Dragon voice recognition software and may include unintentional dictation errors.   Darci CurrentBrown, Hayward N, MD 10/07/18 0300
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
## 2018-10-07 ENCOUNTER — Inpatient Hospital Stay: Payer: BLUE CROSS/BLUE SHIELD

## 2018-10-07 ENCOUNTER — Inpatient Hospital Stay (HOSPITAL_COMMUNITY)
Admit: 2018-10-07 | Discharge: 2018-10-07 | Disposition: A | Discharge status: Home / Self Care | Payer: BLUE CROSS/BLUE SHIELD | Attending: Pulmonary Disease | Admitting: Pulmonary Disease

## 2018-10-07 DIAGNOSIS — F1910 Other psychoactive substance abuse, uncomplicated: Secondary | ICD-10-CM | POA: Diagnosis present

## 2018-10-07 DIAGNOSIS — J9600 Acute respiratory failure, unspecified whether with hypoxia or hypercapnia: Secondary | ICD-10-CM | POA: Diagnosis present

## 2018-10-07 DIAGNOSIS — T50901A Poisoning by unspecified drugs, medicaments and biological substances, accidental (unintentional), initial encounter: Secondary | ICD-10-CM | POA: Diagnosis present

## 2018-10-07 DIAGNOSIS — I361 Nonrheumatic tricuspid (valve) insufficiency: Secondary | ICD-10-CM

## 2018-10-07 DIAGNOSIS — G92 Toxic encephalopathy: Secondary | ICD-10-CM | POA: Diagnosis present

## 2018-10-07 DIAGNOSIS — F17210 Nicotine dependence, cigarettes, uncomplicated: Secondary | ICD-10-CM | POA: Diagnosis present

## 2018-10-07 DIAGNOSIS — F419 Anxiety disorder, unspecified: Secondary | ICD-10-CM | POA: Diagnosis present

## 2018-10-07 DIAGNOSIS — F1190 Opioid use, unspecified, uncomplicated: Secondary | ICD-10-CM | POA: Diagnosis present

## 2018-10-07 DIAGNOSIS — I248 Other forms of acute ischemic heart disease: Secondary | ICD-10-CM | POA: Diagnosis present

## 2018-10-07 DIAGNOSIS — Z79899 Other long term (current) drug therapy: Secondary | ICD-10-CM | POA: Diagnosis not present

## 2018-10-07 DIAGNOSIS — T50901D Poisoning by unspecified drugs, medicaments and biological substances, accidental (unintentional), subsequent encounter: Secondary | ICD-10-CM | POA: Diagnosis not present

## 2018-10-07 DIAGNOSIS — T401X1A Poisoning by heroin, accidental (unintentional), initial encounter: Secondary | ICD-10-CM | POA: Diagnosis present

## 2018-10-07 DIAGNOSIS — J690 Pneumonitis due to inhalation of food and vomit: Secondary | ICD-10-CM | POA: Diagnosis present

## 2018-10-07 DIAGNOSIS — A419 Sepsis, unspecified organism: Secondary | ICD-10-CM | POA: Diagnosis not present

## 2018-10-07 DIAGNOSIS — R230 Cyanosis: Secondary | ICD-10-CM | POA: Diagnosis present

## 2018-10-07 DIAGNOSIS — Z791 Long term (current) use of non-steroidal anti-inflammatories (NSAID): Secondary | ICD-10-CM | POA: Diagnosis not present

## 2018-10-07 DIAGNOSIS — J9601 Acute respiratory failure with hypoxia: Secondary | ICD-10-CM | POA: Diagnosis present

## 2018-10-07 LAB — URINE DRUG SCREEN, QUALITATIVE (ARMC ONLY)
Amphetamines, Ur Screen: POSITIVE — AB
BARBITURATES, UR SCREEN: NOT DETECTED
Benzodiazepine, Ur Scrn: NOT DETECTED
CANNABINOID 50 NG, UR ~~LOC~~: NOT DETECTED
COCAINE METABOLITE, UR ~~LOC~~: NOT DETECTED
MDMA (ECSTASY) UR SCREEN: NOT DETECTED
Methadone Scn, Ur: NOT DETECTED
OPIATE, UR SCREEN: POSITIVE — AB
PHENCYCLIDINE (PCP) UR S: NOT DETECTED
Tricyclic, Ur Screen: NOT DETECTED

## 2018-10-07 LAB — ACETAMINOPHEN LEVEL: Acetaminophen (Tylenol), Serum: <10 ug/mL — ABNORMAL LOW (ref 10–30)

## 2018-10-07 LAB — TROPONIN I
Troponin I: 0.03 ng/mL (ref <0.03)
Troponin I: <0.03 ng/mL (ref <0.03)
Troponin I: 0.05 ng/mL (ref <0.03)

## 2018-10-07 LAB — SALICYLATE LEVEL: Salicylate Lvl: <7 mg/dL (ref 2.8–30.0)

## 2018-10-07 LAB — LACTIC ACID, PLASMA
Lactic Acid, Venous: 1.3 mmol/L (ref 0.5–1.9)
Lactic Acid, Venous: 1.3 mmol/L (ref 0.5–1.9)

## 2018-10-07 LAB — COMPREHENSIVE METABOLIC PANEL
ALT: 48 U/L — AB (ref 0–44)
ALT: 42 U/L (ref 0–44)
ANION GAP: 6 (ref 5–15)
AST: 61 U/L — ABNORMAL HIGH (ref 15–41)
AST: 47 U/L — ABNORMAL HIGH (ref 15–41)
Albumin: 3.9 g/dL (ref 3.5–5.0)
Albumin: 3.5 g/dL (ref 3.5–5.0)
Alkaline Phosphatase: 63 U/L (ref 38–126)
Alkaline Phosphatase: 53 U/L (ref 38–126)
Anion gap: 7 (ref 5–15)
BUN: 14 mg/dL (ref 6–20)
BUN: 13 mg/dL (ref 6–20)
CO2: 31 mmol/L (ref 22–32)
CO2: 29 mmol/L (ref 22–32)
Calcium: 8.7 mg/dL — ABNORMAL LOW (ref 8.9–10.3)
Calcium: 8.6 mg/dL — ABNORMAL LOW (ref 8.9–10.3)
Chloride: 102 mmol/L (ref 98–111)
Chloride: 102 mmol/L (ref 98–111)
Creatinine, Ser: 0.76 mg/dL (ref 0.61–1.24)
Creatinine, Ser: 0.73 mg/dL (ref 0.61–1.24)
GFR calc Af Amer: >60 mL/min (ref >60)
GFR calc non Af Amer: >60 mL/min (ref >60)
GFR calc non Af Amer: >60 mL/min (ref >60)
Glucose, Bld: 116 mg/dL — ABNORMAL HIGH (ref 70–99)
Glucose, Bld: 122 mg/dL — ABNORMAL HIGH (ref 70–99)
Potassium: 4.3 mmol/L (ref 3.5–5.1)
Potassium: 3.7 mmol/L (ref 3.5–5.1)
Sodium: 140 mmol/L (ref 135–145)
Sodium: 137 mmol/L (ref 135–145)
Total Bilirubin: 0.8 mg/dL (ref 0.3–1.2)
Total Bilirubin: 0.6 mg/dL (ref 0.3–1.2)
Total Protein: 6.7 g/dL (ref 6.5–8.1)
Total Protein: 5.9 g/dL — ABNORMAL LOW (ref 6.5–8.1)

## 2018-10-07 LAB — CBC
HCT: 48.7 % (ref 39.0–52.0)
Hemoglobin: 16.2 g/dL (ref 13.0–17.0)
MCH: 33.7 pg (ref 26.0–34.0)
MCHC: 33.3 g/dL (ref 30.0–36.0)
MCV: 101.2 fL — ABNORMAL HIGH (ref 80.0–100.0)
PLATELETS: 236 10*3/uL (ref 150–400)
RBC: 4.81 MIL/uL (ref 4.22–5.81)
RDW: 11.3 % — ABNORMAL LOW (ref 11.5–15.5)
WBC: 8.8 10*3/uL (ref 4.0–10.5)
nRBC: 0 % (ref 0.0–0.2)

## 2018-10-07 LAB — PROCALCITONIN: Procalcitonin: 0.5 ng/mL

## 2018-10-07 LAB — ECHOCARDIOGRAM COMPLETE
Height: 70 in
Weight: 3220.48 oz

## 2018-10-07 LAB — CK: Total CK: 389 U/L (ref 49–397)

## 2018-10-07 LAB — ETHANOL: Alcohol, Ethyl (B): <10 mg/dL (ref <10)

## 2018-10-07 LAB — GLUCOSE, CAPILLARY: Glucose-Capillary: 110 mg/dL — ABNORMAL HIGH (ref 70–99)

## 2018-10-07 LAB — MRSA PCR SCREENING: MRSA BY PCR: NEGATIVE

## 2018-10-07 MED ORDER — LORAZEPAM 2 MG/ML IJ SOLN
1.0000 mg | Freq: Once | INTRAMUSCULAR | Status: AC
  Administered 2018-10-07: 1 mg via INTRAVENOUS
  Filled 2018-10-07: qty 1

## 2018-10-07 MED ORDER — VITAMIN B-1 100 MG PO TABS
100.0000 mg | ORAL_TABLET | Freq: Every day | ORAL | Status: DC
  Administered 2018-10-07 – 2018-10-08 (×2): 100 mg via ORAL
  Filled 2018-10-07 (×2): qty 1

## 2018-10-07 MED ORDER — ONDANSETRON HCL 4 MG/2ML IJ SOLN: 4.0000 mg | Freq: Four times a day (QID) | INTRAMUSCULAR | Status: DC | PRN

## 2018-10-07 MED ORDER — FAMOTIDINE IN NACL 20-0.9 MG/50ML-% IV SOLN
20.0000 mg | Freq: Two times a day (BID) | INTRAVENOUS | Status: DC
  Administered 2018-10-07 – 2018-10-08 (×4): 20 mg via INTRAVENOUS
  Filled 2018-10-07 (×4): qty 50

## 2018-10-07 MED ORDER — NALOXONE HCL 2 MG/2ML IJ SOSY
0.5000 mg/h | PREFILLED_SYRINGE | INTRAVENOUS | Status: DC
  Administered 2018-10-07 (×2): 1 mg/h via INTRAVENOUS
  Administered 2018-10-07: 0.5 mg/h via INTRAVENOUS
  Filled 2018-10-07 (×4): qty 4

## 2018-10-07 MED ORDER — DEXTROSE-NACL 5-0.45 % IV SOLN
INTRAVENOUS | Status: DC
  Administered 2018-10-07: 04:00:00 via INTRAVENOUS

## 2018-10-07 MED ORDER — METOPROLOL TARTRATE 5 MG/5ML IV SOLN: 2.5000 mg | Freq: Four times a day (QID) | INTRAVENOUS | Status: DC | PRN

## 2018-10-07 MED ORDER — LORAZEPAM 2 MG/ML IJ SOLN
1.0000 mg | INTRAMUSCULAR | Status: DC | PRN
  Administered 2018-10-07: 2 mg via INTRAVENOUS
  Administered 2018-10-07 (×2): 1 mg via INTRAVENOUS
  Filled 2018-10-07 (×3): qty 1

## 2018-10-07 MED ORDER — NICOTINE 14 MG/24HR TD PT24
14.0000 mg | MEDICATED_PATCH | Freq: Every day | TRANSDERMAL | Status: DC
  Administered 2018-10-07 – 2018-10-08 (×2): 14 mg via TRANSDERMAL
  Filled 2018-10-07 (×2): qty 1

## 2018-10-07 MED ORDER — SODIUM CHLORIDE 0.9 % IV SOLN
3.0000 g | Freq: Four times a day (QID) | INTRAVENOUS | Status: DC
  Administered 2018-10-07 – 2018-10-08 (×6): 3 g via INTRAVENOUS
  Filled 2018-10-07 (×8): qty 3

## 2018-10-07 MED ORDER — ACETAMINOPHEN 325 MG PO TABS: 650.0000 mg | ORAL_TABLET | ORAL | Status: DC | PRN

## 2018-10-07 MED ORDER — FOLIC ACID 1 MG PO TABS
1.0000 mg | ORAL_TABLET | Freq: Every day | ORAL | Status: DC
  Administered 2018-10-07 – 2018-10-08 (×2): 1 mg via ORAL
  Filled 2018-10-07 (×2): qty 1

## 2018-10-07 MED ORDER — ALBUTEROL SULFATE (2.5 MG/3ML) 0.083% IN NEBU: 2.5000 mg | INHALATION_SOLUTION | RESPIRATORY_TRACT | Status: DC | PRN

## 2018-10-07 MED ORDER — DEXTROSE IN LACTATED RINGERS 5 % IV SOLN
INTRAVENOUS | Status: DC
  Administered 2018-10-07 (×2): via INTRAVENOUS

## 2018-10-07 MED ORDER — ENOXAPARIN SODIUM 40 MG/0.4ML ~~LOC~~ SOLN
40.0000 mg | SUBCUTANEOUS | Status: DC
  Administered 2018-10-07: 40 mg via SUBCUTANEOUS
  Filled 2018-10-07: qty 0.4

## 2018-10-07 MED ORDER — ADULT MULTIVITAMIN W/MINERALS CH
1.0000 | ORAL_TABLET | Freq: Every day | ORAL | Status: DC
  Administered 2018-10-07 – 2018-10-08 (×2): 1 via ORAL
  Filled 2018-10-07 (×2): qty 1

## 2018-10-07 MED ORDER — NALOXONE HCL 4 MG/10ML IJ SOLN
1.0000 mg/h | INTRAVENOUS | Status: DC
  Filled 2018-10-07: qty 10

## 2018-10-07 MED ORDER — LORAZEPAM 2 MG/ML IJ SOLN
0.5000 mg | Freq: Once | INTRAMUSCULAR | Status: AC
  Administered 2018-10-07: 0.5 mg via INTRAVENOUS

## 2018-10-07 MED ORDER — LORAZEPAM 2 MG/ML IJ SOLN
INTRAMUSCULAR | Status: AC
  Administered 2018-10-07: 0.5 mg via INTRAVENOUS
  Filled 2018-10-07: qty 1

## 2018-10-07 MED ORDER — ALBUTEROL SULFATE (2.5 MG/3ML) 0.083% IN NEBU
2.5000 mg | INHALATION_SOLUTION | RESPIRATORY_TRACT | Status: DC
  Administered 2018-10-07: 2.5 mg via RESPIRATORY_TRACT
  Filled 2018-10-07 (×2): qty 3

## 2018-10-07 NOTE — Progress Notes (Signed)
Pharmacy Antibiotic Note  Raymond Cole is a 23 y.o. male admitted on 10/06/2018 with pneumonia.  Pharmacy has been consulted for Unasyn dosing.  Plan: Will start patient on Unasyn 3g IV q6h for possible aspiration  Height: 5\' 10"  (177.8 cm) Weight: 201 lb 4.5 oz (91.3 kg) IBW/kg (Calculated) : 73  Temp (24hrs), Avg:101.8 F (38.8 C), Min:101.8 F (38.8 C), Max:101.8 F (38.8 C)  Recent Labs  Lab 10/07/18 0218  WBC 8.8  CREATININE 0.76    Estimated Creatinine Clearance: 165.9 mL/min (by C-G formula based on SCr of 0.76 mg/dL).    No Known Allergies   Thank you for allowing pharmacy to be a part of this patient's care.  Thomasene Ripple, PharmD, BCPS Clinical Pharmacist 10/07/2018

## 2018-10-07 NOTE — Progress Notes (Signed)
[facility] [place] [facility]   PATIENT NAME: Raymond Cole    MR#:  [id number]  DATE OF BIRTH:  05-12-1996  SUBJECTIVE:  CHIEF COMPLAINT: Patient is seen and examined on Narcan drip for polysubstance overdose  REVIEW OF SYSTEMS:  Review of system unobtainable  DRUG ALLERGIES:  No Known Allergies  VITALS:  Blood pressure 136/86, pulse 97, temperature (!) 100.6 F (38.1 C), temperature source Axillary, resp. rate (!) 21, height 5\' 10"  (1.778 m), weight 91.3 kg, SpO2 96 %.  PHYSICAL EXAMINATION:  GENERAL:  23 y.o.-year-old patient lying in the bed with no acute distress.  EYES: Pupils equal, round, reactive to light and accommodation. No scleral icterus. Extraocular muscles intact.  HEENT: Head atraumatic, normocephalic. Oropharynx and nasopharynx clear.  NECK:  Supple, no jugular venous distention. No thyroid enlargement, no tenderness.  LUNGS: Normal breath sounds bilaterally, no wheezing, rales,rhonchi or crepitation. No use of accessory muscles of respiration.  CARDIOVASCULAR: S1, S2 normal. No murmurs, rubs, or gallops.  ABDOMEN: Soft, nontender, nondistended. Bowel sounds presen EXTREMITIES: No pedal edema, cyanosis, or clubbing.  NEUROLOGIC: Encephalopathic. Sensation intact. Gait not checked.  PSYCHIATRIC: The patient is encephalopathic SKIN: No obvious rash, lesion, or ulcer.    LABORATORY PANEL:   CBC Recent Labs  Lab 10/07/18 0218  WBC 8.8  HGB 16.2  HCT 48.7  PLT 236   ------------------------------------------------------------------------------------------------------------------  Chemistries  Recent Labs  Lab 10/07/18 0421  NA 137  K 3.7  CL 102  CO2 29  GLUCOSE 122*  BUN 13  CREATININE 0.73  CALCIUM 8.6*  AST 47*  ALT 42  ALKPHOS 53  BILITOT 0.6   ------------------------------------------------------------------------------------------------------------------  Cardiac Enzymes Recent Labs  Lab  10/07/18 1008  TROPONINI <0.03   ------------------------------------------------------------------------------------------------------------------  RADIOLOGY:  Dg Chest Port 1 View  Result Date: 10/07/2018 CLINICAL DATA:  Acute respiratory difficulty EXAM: PORTABLE CHEST 1 VIEW COMPARISON:  05/19/2016 FINDINGS: Cardiac shadow is mildly enlarged but accentuated by the portable technique. The left lung remains clear. The right lung now shows diffuse infiltrate throughout the lung but worst in the right upper lobe. No bony abnormality is noted. IMPRESSION: Patchy right-sided infiltrate worst in the upper lobe. Electronically Signed   By: Alcide Clever M.D.   On: 10/07/2018 03:18    EKG:   Orders placed or performed during the hospital encounter of 10/06/18  . EKG 12-Lead  . EKG 12-Lead    ASSESSMENT AND PLAN:   *Acute hypoxic respiratory failure Suspected due to unintentional polysubstance overdose that included heroin, alcohol, and amphetamines Wean off Narcan as tolerated  supplemental oxygen wean as tolerated, aspiration/fall precautions while in house  *Acute toxic metabolic encephalopathy Secondary to polysubstance abuse  Neurochecks per routine, wean off  Narcan drip, supplemental oxygen wean as tolerated, head of bed at 30 degrees, aspiration/fall/skin care precautions while in house, IV fluids for rehydration, and continue close medical monitoring  *Acute recurrent polysubstance abuse Noted history of IV drug abuse, urine drug screen noted for amphetamine/opioids, admits to using heroin once again Supportive care, social work consultation given recurrent drug abuse issues, consider psychiatry consultation when sensorium improves, all other plans as stated above  *Chronic tobacco smoker abuse/dependency Nicotine patch daily, cessation counseling when sensorium improves  Disposition pending clinical course     All the records are reviewed and case discussed with Care  Management/Social Workerr. No family members at bedside.  CODE STATUS: fc  TOTAL TIME TAKING CARE OF THIS PATIENT: 36 minutes.  POSSIBLE D/C IN 2-3  DAYS, DEPENDING ON CLINICAL CONDITION.  Note: This dictation was prepared with Dragon dictation along with smaller phrase technology. Any transcriptional errors that result from this process are unintentional.   Ramonita Lab M.D on 10/07/2018 at 3:40 PM  Between 7am to 6pm - Pager - [phone] After 6pm go to [email or web address] - password [email or web address]  [facility]  Office  [phone]  CC: Primary care physician; Patient, No Pcp Per

## 2018-10-07 NOTE — ED Notes (Signed)
Pt able to stand and ambulate to the toilet without difficulty. Steady gait at this time.

## 2018-10-07 NOTE — Progress Notes (Signed)
CRITICAL VALUE ALERT  Critical Value:  0.03 Troponin  Date & Time Notied:10/07/18 03:41  Provider Notified: Jeri Modena, NP  Orders Received/Actions taken: no order

## 2018-10-07 NOTE — H&P (Signed)
[employer] - [place] [facility]   PATIENT NAME: Raymond HintBrandon Cole    MR#:  [id number]  DATE OF BIRTH:  12/24/1995  DATE OF ADMISSION:  10/06/2018  PRIMARY CARE PHYSICIAN: Patient, No Pcp Per   REQUESTING/REFERRING PHYSICIAN:   CHIEF COMPLAINT:   Chief Complaint  Patient presents with  . Drug Overdose    HISTORY OF PRESENT ILLNESS: Raymond Cole  is a 23 y.o. male with a known history of IV drug abuse, tobacco smoking, found by family/father to be unresponsive in the home, brought via EMS to the emergency room for further evaluation/care, patient was noted to be cyanotic/hypoxic with O2 saturation in the 80s, patient was given Narcan with good response, during moments of lucency patient did state that he was also drinking alcohol/using heroin, hospitalist asked to admit given patient's inability to maintain consciousness/requiring Narcan drip, patient noted to be tachycardic, urine drug screen noted for amphetamine/opioids, patient evaluated at the bedside, the patient's father is at the bedside, patient is poor historian due to acute encephalopathy, patient is now being admitted for acute hypoxic respiratory failure, encephalopathy secondary to acute drug overdose.  PAST MEDICAL HISTORY:   Past Medical History:  Diagnosis Date  . Anxiety    NO MEDS  . Pneumonia    H/O    PAST SURGICAL HISTORY:  Past Surgical History:  Procedure Laterality Date  . MOUTH SURGERY      SOCIAL HISTORY:  Social History   Tobacco Use  . Smoking status: Current Every Day Smoker    Packs/day: 1.00    Years: 5.00    Pack years: 5.00    Types: Cigarettes  . Smokeless tobacco: Never Used  Substance Use Topics  . Alcohol use: Yes    Comment: rare    FAMILY HISTORY: History reviewed. No pertinent family history.  DRUG ALLERGIES: No Known Allergies  REVIEW OF SYSTEMS: Unable to be obtained given poor historian/encephalopathy, father at the bedside  CONSTITUTIONAL: No fever,  fatigue or weakness.  EYES: No blurred or double vision.  EARS, NOSE, AND THROAT: No tinnitus or ear pain.  RESPIRATORY: No cough, shortness of breath, wheezing or hemoptysis.  CARDIOVASCULAR: No chest pain, orthopnea, edema.  GASTROINTESTINAL: No nausea, vomiting, diarrhea or abdominal pain.  GENITOURINARY: No dysuria, hematuria.  ENDOCRINE: No polyuria, nocturia,  HEMATOLOGY: No anemia, easy bruising or bleeding SKIN: No rash or lesion. MUSCULOSKELETAL: No joint pain or arthritis.   NEUROLOGIC: No tingling, numbness, weakness.  PSYCHIATRY: No anxiety or depression.   MEDICATIONS AT HOME:  Prior to Admission medications   Medication Sig Start Date End Date Taking? Authorizing Provider  calcium carbonate (TUMS - DOSED IN MG ELEMENTAL CALCIUM) 500 MG chewable tablet Chew 1 tablet by mouth as needed for indigestion or heartburn.    [provider]  ibuprofen (ADVIL,MOTRIN) 200 MG tablet Take 800 mg by mouth every 6 (six) hours as needed for fever, mild pain or moderate pain.    [provider]      PHYSICAL EXAMINATION:   VITAL SIGNS: Blood pressure (!) 143/86, pulse (!) 132, resp. rate (!) 31, weight 77.1 kg, SpO2 99 %.  GENERAL:  23 y.o.-year-old patient lying in the bed with no acute distress.  EYES: Pupils equal, round, reactive to light and accommodation. No scleral icterus. Extraocular muscles intact.  HEENT: Head atraumatic, normocephalic. Oropharynx and nasopharynx clear.  NECK:  Supple, no jugular venous distention. No thyroid enlargement, no tenderness.  LUNGS: Normal breath sounds bilaterally, no wheezing, rales,rhonchi  or crepitation. No use of accessory muscles of respiration.  CARDIOVASCULAR: S1, S2 normal. No murmurs, rubs, or gallops.  ABDOMEN: Soft, nontender, nondistended. Bowel sounds present. No organomegaly or mass.  EXTREMITIES: No pedal edema, cyanosis, or clubbing.  NEUROLOGIC: Cranial nerves II through XII are intact. MAES. Gait not checked.   PSYCHIATRIC: The patient is obtunded.  SKIN: No obvious rash, lesion, or ulcer.   LABORATORY PANEL:   CBC Recent Labs  Lab 10/07/18 0218  WBC 8.8  HGB 16.2  HCT 48.7  PLT 236  MCV 101.2*  MCH 33.7  MCHC 33.3  RDW 11.3*   ------------------------------------------------------------------------------------------------------------------  Chemistries  Recent Labs  Lab 10/07/18 0218  NA 140  K 4.3  CL 102  CO2 31  GLUCOSE 116*  BUN 14  CREATININE 0.76  CALCIUM 8.7*  AST 61*  ALT 48*  ALKPHOS 63  BILITOT 0.8   ------------------------------------------------------------------------------------------------------------------ estimated creatinine clearance is 150.8 mL/min (by C-G formula based on SCr of 0.76 mg/dL). ------------------------------------------------------------------------------------------------------------------ No results for input(s): TSH, T4TOTAL, T3FREE, THYROIDAB in the last 72 hours.  Invalid input(s): FREET3   Coagulation profile No results for input(s): INR, PROTIME in the last 168 hours. ------------------------------------------------------------------------------------------------------------------- No results for input(s): DDIMER in the last 72 hours. -------------------------------------------------------------------------------------------------------------------  Cardiac Enzymes No results for input(s): CKMB, TROPONINI, MYOGLOBIN in the last 168 hours.  Invalid input(s): CK ------------------------------------------------------------------------------------------------------------------ Invalid input(s): POCBNP  ---------------------------------------------------------------------------------------------------------------  Urinalysis    Component Value Date/Time   COLORURINE YELLOW (A) 08/28/2018 1438   APPEARANCEUR CLOUDY (A) 08/28/2018 1438   LABSPEC 1.013 08/28/2018 1438   PHURINE 7.0 08/28/2018 1438   GLUCOSEU NEGATIVE  08/28/2018 1438   HGBUR NEGATIVE 08/28/2018 1438   BILIRUBINUR NEGATIVE 08/28/2018 1438   KETONESUR NEGATIVE 08/28/2018 1438   PROTEINUR NEGATIVE 08/28/2018 1438   NITRITE NEGATIVE 08/28/2018 1438   LEUKOCYTESUR NEGATIVE 08/28/2018 1438     RADIOLOGY: No results found.  EKG: Orders placed or performed during the hospital encounter of 10/06/18  . EKG 12-Lead  . EKG 12-Lead    IMPRESSION AND PLAN: *Acute hypoxic respiratory failure Suspected due to unintentional polysubstance overdose that included heroin, alcohol, and amphetamines Admit to stepdown unit on our ICU protocol, supplemental oxygen wean as tolerated, aspiration/fall precautions while in house  *Acute toxic metabolic encephalopathy Secondary to polysubstance abuse  Neurochecks per routine, continue Narcan drip, supplemental oxygen wean as tolerated, head of bed at 30 degrees, aspiration/fall/skin care precautions while in house, IV fluids for rehydration, and continue close medical monitoring  *Acute recurrent polysubstance abuse Noted history of IV drug abuse, urine drug screen noted for amphetamine/opioids, admits to using heroin once again Supportive care, social work consultation given recurrent drug abuse issues, consider psychiatry consultation when sensorium improves, all other plans as stated above  *Chronic tobacco smoker abuse/dependency Nicotine patch daily, cessation counseling when sensorium improves  Disposition pending clinical course  All the records are reviewed and case discussed with ED provider. Management plans discussed with the patient, family and they are in agreement.  CODE STATUS:full    TOTAL TIME TAKING CARE OF THIS PATIENT: 40 minutes.    Evelena Asa Salary M.D on 10/07/2018   Between 7am to 6pm - Pager - [phone]  After 6pm go to [email or web address] - password [email or web address]  [facility]  Office  [phone]  CC: Primary care physician; Patient, No Pcp  Per   Note: This dictation was prepared with Dragon dictation along with smaller phrase technology. Any transcriptional errors that result from this process are  unintentional.

## 2018-10-07 NOTE — Clinical Social Work Note (Signed)
CSW consulted for trying to reach POA. Physician reached patient's father via phone this morning. CSW will see patient regarding drug rehab resources when patient is more appropriate for assessment. York Spaniel MSW,LCSW [phone]

## 2018-10-07 NOTE — Progress Notes (Signed)
Pt is to obtunded to collect a expectorated sputum sample. Will be collected when pt is able to follow commands.

## 2018-10-07 NOTE — Progress Notes (Signed)
Pharmacy Antibiotic Note  Raymond Cole is a 23 y.o. male admitted on 10/06/2018 with drug overdose and aspiration PNA.  Pharmacy has been consulted for Unasyn dosing.  CXR: Patchy right-sided infiltrate worst in the upper lobe  Plan: Continue Unasyn 3g IV q6h.  Height: 5\' 10"  (177.8 cm) Weight: 201 lb 4.5 oz (91.3 kg) IBW/kg (Calculated) : 73  Temp (24hrs), Avg:100.9 F (38.3 C), Min:99.9 F (37.7 C), Max:101.8 F (38.8 C)  Recent Labs  Lab 10/07/18 0218 10/07/18 0420 10/07/18 0421 10/07/18 0627  WBC 8.8  --   --   --   CREATININE 0.76  --  0.73  --   LATICACIDVEN  --  1.3  --  1.3    Estimated Creatinine Clearance: 165.9 mL/min (by C-G formula based on SCr of 0.73 mg/dL).    No Known Allergies  Antibiotics  2/13 Unasyn>>   Thank you for allowing pharmacy to be a part of this patient's care.  Gardner Candle, PharmD, BCPS Clinical Pharmacist 10/07/2018 11:18 AM

## 2018-10-07 NOTE — Progress Notes (Signed)
*  PRELIMINARY RESULTS* Echocardiogram 2D Echocardiogram has been performed.  Raymond Cole 10/07/2018, 9:54 AM

## 2018-10-07 NOTE — ED Notes (Signed)
Pts family reports that pt drinks liquor frequently and in large amounts. Unknown time since last drink.

## 2018-10-07 NOTE — Consult Note (Signed)
Name: Raymond Cole MRN: [id number] DOB: 07-07-1996    ADMISSION DATE:  10/06/2018 CONSULTATION DATE:  10/07/2018  REFERRING MD :  Dr. Katheren Shams  CHIEF COMPLAINT:  Drug Overdose  BRIEF PATIENT DESCRIPTION:  23 y.o. Male admitted with Unintentional Drug Overdose (Heroin, Alcohol, and Amphetamines) requiring Narcan drip, and with Acute Hypoxic Respiratory Failure secondary to Aspiration Pneumonia.  Pt also with questionable ETOH withdrawal, requiring CIWA protocol.  SIGNIFICANT EVENTS  10/07/18>> Admission to [facility] Stepdown  STUDIES:  Echocardiogram 10/07/18>>  CULTURES: Sputum 2/13>> Blood x2 2/13>>  ANTIBIOTICS: Unasyn 2/13>>  HISTORY OF PRESENT ILLNESS:   Raymond Cole is a 23 year old male with a past medical history of IV drug abuse and tobacco abuse who presents to [facility] ED on 10/06/18 after being found unresponsive and cyanotic by his father at home.  EMS administered 2 mg of Narcan with noted improvement in mental status and respiratory rate.  The patient admited to IV heroin use today and heavy daily alcohol use.  He reported that he has not drank as much as usual today.  The patient received multiple dose of Narcan injections in the ED, and was subsequently placed on Narcan drip due to hypoxia and bradypnea.  Initial workup in the ED revealed AST 61, ALT 48, CK 389, Troponin 0.03, and Ethyl Alcohol <10.  Urine drug screen is positive for Amphetamines and opiates.  Serum acetaminophen <10, and salicylates <7.  CXR obtained which is concerning for diffuse infiltrate of the right lung.  He is admitted to [facility] unit for treatment of Unintentional drug overdose requiring Narcan drip, Acute Hypoxic Respiratory Failure in the setting of Aspiration Pneumonia, and questionable alcohol withdrawal.  PCCM is consulted for further management.  PAST MEDICAL HISTORY :   has a past medical history of Anxiety and Pneumonia.  has a past surgical history that includes Mouth surgery. Prior to  Admission medications   Medication Sig Start Date End Date Taking? Authorizing Provider  calcium carbonate (TUMS - DOSED IN MG ELEMENTAL CALCIUM) 500 MG chewable tablet Chew 1 tablet by mouth as needed for indigestion or heartburn.    [provider]  ibuprofen (ADVIL,MOTRIN) 200 MG tablet Take 800 mg by mouth every 6 (six) hours as needed for fever, mild pain or moderate pain.    [provider]   No Known Allergies  FAMILY HISTORY:  family history is not on file. SOCIAL HISTORY:  reports that he has been smoking cigarettes. He has a 5.00 pack-year smoking history. He has never used smokeless tobacco. He reports current alcohol use. He reports current drug use. Drugs: Marijuana, Cocaine, and Benzodiazepines.  REVIEW OF SYSTEMS:  Positives in BOLD Constitutional: Negative for fever, +chills, weight loss, malaise/fatigue and +diaphoresis, +generalized pain   HENT: Negative for hearing loss, ear pain, nosebleeds, congestion, sore throat, neck pain, tinnitus and ear discharge.   Eyes: Negative for blurred vision, double vision, photophobia, pain, discharge and redness.  Respiratory: Negative for cough, hemoptysis, sputum production, shortness of breath, wheezing and stridor.   Cardiovascular: Negative for chest pain, palpitations, orthopnea, claudication, leg swelling and PND.  Gastrointestinal: Negative for heartburn, nausea, vomiting, abdominal pain, diarrhea, constipation, blood in stool and melena.  Genitourinary: Negative for dysuria, urgency, frequency, hematuria and flank pain.  Musculoskeletal: Negative for myalgias, back pain, joint pain and falls.  Skin: Negative for itching and rash.  Neurological: Negative for dizziness, tingling, tremors, sensory change, speech change, focal weakness, seizures, loss of consciousness, weakness and headaches.  Endo/Heme/Allergies: Negative  for environmental allergies and polydipsia. Does not bruise/bleed easily.  SUBJECTIVE:  Pt  denies chest pain, shortness of breath, cough Does report chills and generalized pain Currently on NRB  VITAL SIGNS: Pulse Rate:  [81-132] 132 (02/13 0230) Resp:  [13-40] 31 (02/13 0230) BP: (120-158)/(74-96) 143/86 (02/13 0230) SpO2:  [81 %-100 %] 99 % (02/13 0230) Weight:  [77.1 kg] 77.1 kg (02/12 2328)  PHYSICAL EXAMINATION: General:  Acutely ill appearing male, laying in bed, on NRB mask, in NAD Neuro:  Awake, Alert, oriented only to self, follows commands, no focal deficits, speech clear HEENT:  Atraumatic, normocephalic, neck supple, speech clear Cardiovascular:  Tachycardia, regular rhythm, s1s2, no M/R/G, 2+ pulses Lungs:  Clear to auscultation bilaterally, even, nonlabored, normal effort Abdomen:  Soft, nontender, nondistended, no guarding or rebound tenderness, BS+ x4 Musculoskeletal:  No deformities, normal bulk and tone, no edema Skin:  Warm/diaphoretic. No obvious rashes, lesions, or ulcerations  Recent Labs  Lab 10/07/18 0218  NA 140  K 4.3  CL 102  CO2 31  BUN 14  CREATININE 0.76  GLUCOSE 116*   Recent Labs  Lab 10/07/18 0218  HGB 16.2  HCT 48.7  WBC 8.8  PLT 236   No results found.  ASSESSMENT / PLAN:  Acute Hypoxic Respiratory Failure in setting of Aspiration Pneumonia and Drug overdose -Supplemental O2 as needed to maintain O2 sats >92% -Follow intermittent CXR and ABG as needed -Start Unasyn -Obtain sputum culture  Meets SIRS Criteria Sepsis secondary to Aspiration Pneumonia -Monitor fever curve -Follow WBC's and Procalcitonin -Follow Blood and sputum cultures -Start Unasyn -Trend Lactic acid -Obtain Echocardiogram to check for Endocarditis/vegetations given IV drug abuse  Unintentional Drug Overdose with Heroin, Alcohol, and Amphetamines Acute Metabolic Encephalopathy in setting of Drug Overdose ? ETOH Withdrawal -Cardiac monitoring -Narcan drip -Aspiration precautions -Frequent Neuro checks -IVF -Check CK -Check serum  Acetaminophen and Saliclyates -Provide supportive care -CIWA protocol, prn Ativan for possible withdrawal -Thiamine, Folic acid, Multivitamin   Mildly Elevated Troponin, likely demand ischemia -Cardiac monitoring -Trend Troponin  Mildly Elevated LFT's -Trend LFT's -Check serum Acetaminophen    DISPOSITION: Stepdown GOALS OF CARE: Full Code VTE PROPHYLAXIS: Lovenox UPDATES: Updated pt at bedside 10/07/18  Harlon Ditty, [facility] Pager: [phone] Cell: [phone]  10/07/2018, 3:02 AM

## 2018-10-07 NOTE — Progress Notes (Signed)
eLink Physician-Brief Progress Note Patient Name: Raymond Cole DOB: Sep 23, 1995 MRN: [id number]   Date of Service  10/07/2018  HPI/Events of Note  A 23 year old male with a history of heroin abuse was a admitted to stepdown because of altered mental status requiring Narcan drip and hypoxia requiring nonrebreather.   eICU Interventions  Urine drug screen positive for amphetamines and opioids.  Titrate the Narcan drip.  Maintain oxygen saturation 92% or more.  Has a history of alcohol abuse, recommend to start the patient on Ativan as per CIWA protocol, thiamine, folic acid and multivitamin.       Intervention Category Major Interventions: Change in mental status - evaluation and management;Hypoxemia - evaluation and management Intermediate Interventions: Communication with other healthcare providers and/or family Evaluation Type: New Patient Evaluation  Glory Rosebush 10/07/2018, 3:44 AM

## 2018-10-07 NOTE — ED Notes (Signed)
ED TO INPATIENT HANDOFF REPORT  Name/Age/Gender Raymond Cole 23 y.o. male  Code Status   Home/SNF/Other Home  Chief Complaint overdose  Level of Care/Admitting Diagnosis ED Disposition    ED Disposition Condition Comment   Admit  Hospital Area: [facility] [[id number]]  Level of Care: Stepdown [14]  Diagnosis: Drug overdose [202575]  Admitting Physician: Bertrum Sol [[id number]]  Attending Physician: Bertrum Sol [[id number]]  Estimated length of stay: past midnight tomorrow  Certification:: I certify this patient will need inpatient services for at least 2 midnights  PT Class (Do Not Modify): Inpatient [101]  PT Acc Code (Do Not Modify): Private [1]       Medical History Past Medical History:  Diagnosis Date  . Anxiety    NO MEDS  . Pneumonia    H/O    Allergies No Known Allergies  IV Location/Drains/Wounds Patient Lines/Drains/Airways Status   Active Line/Drains/Airways    Name:   Placement date:   Placement time:   Site:   Days:   Peripheral IV 10/06/18 Right Antecubital   10/06/18    2331    Antecubital   1          Labs/Imaging Results for orders placed or performed during the hospital encounter of 10/06/18 (from the past 48 hour(s))  Urine Drug Screen, Qualitative ([facility] only)     Status: Abnormal   Collection Time: 10/07/18  1:42 AM  Result Value Ref Range   Tricyclic, Ur Screen NONE DETECTED NONE DETECTED   Amphetamines, Ur Screen POSITIVE (A) NONE DETECTED   MDMA (Ecstasy)Ur Screen NONE DETECTED NONE DETECTED   Cocaine Metabolite,Ur [place] NONE DETECTED NONE DETECTED   Opiate, Ur Screen POSITIVE (A) NONE DETECTED   Phencyclidine (PCP) Ur S NONE DETECTED NONE DETECTED   Cannabinoid 50 Ng, Ur  NONE DETECTED NONE DETECTED   Barbiturates, Ur Screen NONE DETECTED NONE DETECTED   Benzodiazepine, Ur Scrn NONE DETECTED NONE DETECTED   Methadone Scn, Ur NONE DETECTED NONE DETECTED    Comment: (NOTE) Tricyclics +  metabolites, urine    Cutoff 1000 ng/mL Amphetamines + metabolites, urine  Cutoff 1000 ng/mL MDMA (Ecstasy), urine              Cutoff 500 ng/mL Cocaine Metabolite, urine          Cutoff 300 ng/mL Opiate + metabolites, urine        Cutoff 300 ng/mL Phencyclidine (PCP), urine         Cutoff 25 ng/mL Cannabinoid, urine                 Cutoff 50 ng/mL Barbiturates + metabolites, urine  Cutoff 200 ng/mL Benzodiazepine, urine              Cutoff 200 ng/mL Methadone, urine                   Cutoff 300 ng/mL The urine drug screen provides only a preliminary, unconfirmed analytical test result and should not be used for non-medical purposes. Clinical consideration and professional judgment should be applied to any positive drug screen result due to possible interfering substances. A more specific alternate chemical method must be used in order to obtain a confirmed analytical result. Gas chromatography / mass spectrometry (GC/MS) is the preferred confirmat ory method. Performed at [facility], [address] [place], [place] [address]   CBC     Status: Abnormal   Collection Time: 10/07/18  2:18 AM  Result  Value Ref Range   WBC 8.8 4.0 - 10.5 K/uL   RBC 4.81 4.22 - 5.81 MIL/uL   Hemoglobin 16.2 13.0 - 17.0 g/dL   HCT 96.048.7 45.439.0 - 09.852.0 %   MCV 101.2 (H) 80.0 - 100.0 fL   MCH 33.7 26.0 - 34.0 pg   MCHC 33.3 30.0 - 36.0 g/dL   RDW 11.911.3 (L) 14.711.5 - 82.915.5 %   Platelets 236 150 - 400 K/uL   nRBC 0.0 0.0 - 0.2 %    Comment: Performed at [facility], [address] [place], [place] [address]  Comprehensive metabolic panel     Status: Abnormal   Collection Time: 10/07/18  2:18 AM  Result Value Ref Range   Sodium 140 135 - 145 mmol/L   Potassium 4.3 3.5 - 5.1 mmol/L    Comment: HEMOLYSIS AT THIS LEVEL MAY AFFECT RESULT   Chloride 102 98 - 111 mmol/L   CO2 31 22 - 32 mmol/L   Glucose, Bld 116 (H) 70 - 99 mg/dL   BUN 14 6 - 20 mg/dL   Creatinine, Ser 0.860.76 0.61 - 1.24  mg/dL   Calcium 8.7 (L) 8.9 - 10.3 mg/dL   Total Protein 6.7 6.5 - 8.1 g/dL   Albumin 3.9 3.5 - 5.0 g/dL   AST 61 (H) 15 - 41 U/L    Comment: HEMOLYSIS AT THIS LEVEL MAY AFFECT RESULT   ALT 48 (H) 0 - 44 U/L   Alkaline Phosphatase 63 38 - 126 U/L   Total Bilirubin 0.8 0.3 - 1.2 mg/dL    Comment: HEMOLYSIS AT THIS LEVEL MAY AFFECT RESULT   GFR calc non Af Amer >60 >60 mL/min   GFR calc Af Amer >60 >60 mL/min   Anion gap 7 5 - 15    Comment: Performed at [facility], [address] [place], [place] [address]  Ethanol     Status: None   Collection Time: 10/07/18  2:18 AM  Result Value Ref Range   Alcohol, Ethyl (B) <10 <10 mg/dL    Comment: (NOTE) Lowest detectable limit for serum alcohol is 10 mg/dL. For medical purposes only. Performed at [facility], [address] [place], [place] [address]    No results found.  Pending Labs Unresulted Labs (From admission, onward)    Start     Ordered   10/07/18 0258  CK  Add-on,   AD     10/07/18 0257   Signed and Held  HIV antibody (Routine Testing)  Once,   R     Signed and Held   Signed and Held  CBC  (enoxaparin (LOVENOX)    CrCl >/= 30 ml/min)  Once,   R    Comments:  Baseline for enoxaparin therapy IF NOT ALREADY DRAWN.  Notify MD if PLT < 100 K.    Signed and Held   Signed and Held  Creatinine, serum  (enoxaparin (LOVENOX)    CrCl >/= 30 ml/min)  Once,   R    Comments:  Baseline for enoxaparin therapy IF NOT ALREADY DRAWN.    Signed and Held   Signed and Held  Creatinine, serum  (enoxaparin (LOVENOX)    CrCl >/= 30 ml/min)  Weekly,   R    Comments:  while on enoxaparin therapy    Signed and Held   Signed and Held  Troponin I - Now Then Q6H  Now then every 6 hours,   R     Signed and Held  Vitals/Pain Today's Vitals   10/07/18 0201 10/07/18 0215 10/07/18 0230 10/07/18 0300  BP: (!) 158/96  (!) 143/86 124/73  Pulse: (!) 107 (!) 122 (!) 132 (!) 129  Resp: (!) 40 (!) 33 (!) 31 (!) 21   TempSrc:      SpO2: (!) 89% 98% 99% 99%  Weight:      PainSc:        Isolation Precautions No active isolations  Medications Medications  naloxone (NARCAN) 4 mg in dextrose 5 % 250 mL infusion (0 mg/hr Intravenous Paused 10/07/18 0232)  ondansetron (ZOFRAN) injection 4 mg (4 mg Intravenous Given 10/06/18 2331)  naloxone [facility]) injection 1 mg (1 mg Intravenous Given 10/06/18 2331)  sodium chloride 0.9 % bolus 1,000 mL (0 mLs Intravenous Stopped 10/07/18 0055)  LORazepam (ATIVAN) injection 0.5 mg (0.5 mg Intravenous Given 10/07/18 0220)  LORazepam (ATIVAN) injection 1 mg (1 mg Intravenous Given 10/07/18 0245)    Mobility walks

## 2018-10-07 NOTE — ED Notes (Signed)
Pt having increased anxiety and yelling that he can not breath and he wants [place] to "jest let me go, if it is my time to go I want to go" Pt shaking and hyperventilating. Pt requesting a non-rebreather mask. RN applied mask. Pt now laying on his stomach per request with mask on.

## 2018-10-07 NOTE — ED Notes (Signed)
Pt given a urinal for specimen. Pt wants to walk to toilet. Pt has decreasing O2 sats even on 4L of O2. Advised pt to stand beside bed if he must but don't come off the oxygen.

## 2018-10-07 NOTE — ED Notes (Signed)
Family at bedside with patient. Pt in NAD at this time.

## 2018-10-07 NOTE — ED Notes (Signed)
MD at bedside. 

## 2018-10-07 NOTE — ED Notes (Signed)
Cramping appears to have stopped. Pt sleeping in room. Responding to family but drowsy.

## 2018-10-07 NOTE — ED Notes (Signed)
Pt placed on 3L [place] due to decreased oxygen saturation. Pt sitting in bed with eyes closed but denies feeling increased drowsiness and continues to respond to questions appropriately. Pt alert. MD made aware. Family remains at bedside.

## 2018-10-07 NOTE — ED Notes (Signed)
Warm blankets applied. Pt continues to shake and report he is feeling cold.

## 2018-10-07 NOTE — Progress Notes (Signed)
Spiritual Care Visit    10/07/18 1600  Clinical Encounter Type  Visited With Patient and family together  Visit Type Initial;Critical Care;Spiritual support;Psychological support  Consult/Referral To Chaplain  Spiritual Encounters  Spiritual Needs Emotional  Stress Factors  Family Stress Factors Health changes   Milinda Antis, [address]

## 2018-10-07 NOTE — Progress Notes (Signed)
Family Meeting Note  Advance Directive:yes  Today a meeting took place with the Patient and father.  Patient is unable to participate due XM:IWOEHO capacity Obtunded   The following clinical team members were present during this meeting:MD  The following were discussed:Patient's diagnosis: Acute recurrent drug overdose, Patient's progosis: Unable to determine and Goals for treatment: Full Code  Additional follow-up to be provided: prn  Time spent during discussion:20 minutes  Bertrum Sol, MD

## 2018-10-08 LAB — COMPREHENSIVE METABOLIC PANEL
ALT: 31 U/L (ref 0–44)
AST: 26 U/L (ref 15–41)
Albumin: 3.2 g/dL — ABNORMAL LOW (ref 3.5–5.0)
Alkaline Phosphatase: 45 U/L (ref 38–126)
Anion gap: 5 (ref 5–15)
BILIRUBIN TOTAL: 0.6 mg/dL (ref 0.3–1.2)
BUN: 8 mg/dL (ref 6–20)
CO2: 27 mmol/L (ref 22–32)
Calcium: 8.6 mg/dL — ABNORMAL LOW (ref 8.9–10.3)
Chloride: 107 mmol/L (ref 98–111)
Creatinine, Ser: 0.74 mg/dL (ref 0.61–1.24)
Glucose, Bld: 131 mg/dL — ABNORMAL HIGH (ref 70–99)
Potassium: 3.5 mmol/L (ref 3.5–5.1)
Sodium: 139 mmol/L (ref 135–145)
TOTAL PROTEIN: 6.2 g/dL — AB (ref 6.5–8.1)

## 2018-10-08 LAB — CBC
HCT: 45.9 % (ref 39.0–52.0)
Hemoglobin: 15 g/dL (ref 13.0–17.0)
MCH: 34 pg (ref 26.0–34.0)
MCHC: 32.7 g/dL (ref 30.0–36.0)
MCV: 104.1 fL — AB (ref 80.0–100.0)
Platelets: 199 10*3/uL (ref 150–400)
RBC: 4.41 MIL/uL (ref 4.22–5.81)
RDW: 11.2 % — ABNORMAL LOW (ref 11.5–15.5)
WBC: 11.2 10*3/uL — ABNORMAL HIGH (ref 4.0–10.5)
nRBC: 0 % (ref 0.0–0.2)

## 2018-10-08 LAB — CK: Total CK: 77 U/L (ref 49–397)

## 2018-10-08 LAB — HIV ANTIBODY (ROUTINE TESTING W REFLEX): HIV Screen 4th Generation wRfx: NONREACTIVE

## 2018-10-08 LAB — PROCALCITONIN: Procalcitonin: 1 ng/mL

## 2018-10-08 MED ORDER — AMOXICILLIN-POT CLAVULANATE 875-125 MG PO TABS: 1.0000 | ORAL_TABLET | Freq: Two times a day (BID) | ORAL | 0 refills | Status: DC

## 2018-10-08 MED ORDER — AMOXICILLIN-POT CLAVULANATE 875-125 MG PO TABS: 1.0000 | ORAL_TABLET | Freq: Two times a day (BID) | ORAL | Status: DC

## 2018-10-08 MED ORDER — NICOTINE 14 MG/24HR TD PT24: 14.0000 mg | MEDICATED_PATCH | Freq: Every day | TRANSDERMAL | 0 refills | Status: DC

## 2018-10-08 MED ORDER — ADULT MULTIVITAMIN W/MINERALS CH: 1.0000 | ORAL_TABLET | Freq: Every day | ORAL | Status: DC

## 2018-10-08 MED ORDER — THIAMINE HCL 100 MG PO TABS: 100.0000 mg | ORAL_TABLET | Freq: Every day | ORAL | Status: DC

## 2018-10-08 MED ORDER — FOLIC ACID 1 MG PO TABS: 1.0000 mg | ORAL_TABLET | Freq: Every day | ORAL | 1 refills | Status: DC

## 2018-10-08 MED ORDER — LACTATED RINGERS IV SOLN
INTRAVENOUS | Status: DC
  Administered 2018-10-08 (×2): via INTRAVENOUS

## 2018-10-08 NOTE — Care Management (Signed)
Met with patient and his mother to discuss need to have a PCP, offered the patient a resource list, he declined and states that he can get it on his own, he is on his mother's insurance plan

## 2018-10-08 NOTE — Discharge Instructions (Signed)
Follow-up with primary care physician in 3 days Follow-up with drug rehab program as soon as possible in 1 -2day Outpatient follow-up with psychiatry/behavioral therapist in a week Outpatient follow-up with cardiology as needed

## 2018-10-08 NOTE — Progress Notes (Signed)
Patient arrived on unit with mother @ bedside. No acute distress noted. LR reinitiated @ 100 ml/hr via left A/C.Patient denied acute distress.

## 2018-10-08 NOTE — Discharge Summary (Addendum)
[facility] [place] [facility]   PATIENT NAME: Raymond Cole    MR#:  [id number]  DATE OF BIRTH:  [birth year]  DATE OF ADMISSION:  10/06/2018 ADMITTING PHYSICIAN: Bertrum Sol, MD  DATE OF DISCHARGE:  10/08/18   PRIMARY CARE PHYSICIAN: Patient, No Pcp Per    ADMISSION DIAGNOSIS:  Acute respiratory failure (HCC) [J96.00] Accidental overdose of heroin, initial encounter (HCC) [T40.1X1A]  DISCHARGE DIAGNOSIS:  Acute respiratory failure-resolved Accidental drug overdose with heroine Aspiration pneumonia  SECONDARY DIAGNOSIS:   Past Medical History:  Diagnosis Date  . Anxiety    NO MEDS  . Pneumonia    H/O    HOSPITAL COURSE:   HISTORY OF PRESENT ILLNESS: [name]  is a 23 y.o. male with a known history of IV drug abuse, tobacco smoking, found by family/father to be unresponsive in the home, brought via EMS to the emergency room for further evaluation/care, patient was noted to be cyanotic/hypoxic with O2 saturation in the 80s, patient was given Narcan with good response, during moments of lucency patient did state that he was also drinking alcohol/using heroin, hospitalist asked to admit given patient's inability to maintain consciousness/requiring Narcan drip, patient noted to be tachycardic, urine drug screen noted for amphetamine/opioids, patient evaluated at the bedside, the patient's father is at the bedside, patient is poor historian due to acute encephalopathy, patient is now being admitted for acute hypoxic respiratory failure, encephalopathy secondary to acute drug overdose.   *Acute hypoxic respiratory failure due to unintentional polysubstance overdose that included heroin, alcohol, and amphetamines Clinically improved on room air now  *Acute toxic metabolic encephalopathy Secondary to polysubstance abuse-resolved Off Narcan drip Weaned off oxygen to room air  IV fluids provided during the hospital course  *Aspiration  pneumonia Treated with IV Unasyn and IV fluids discharged home with p.o. Augmentin Not on oxygen on room air  *Acute recurrent polysubstance abuse Noted history of IV drug abuse, urine drug screen noted for amphetamine/opioids, admits to using heroin once again Supportive care, social work consultation given recurrent drug abuse issues provided   psychiatry consultation offered patient prefers outpatient follow-up with psychiatry and drug rehab program But eager to go home mom at bedside agreeable to discharge patient Social worker will provide-outpatient rehab resources prior to discharge  *Elevated troponin from demand ischemia patient is asymptomatic Outpatient follow-up with cardiology    *Chronic tobacco smoker abuse/dependency Nicotine patch daily, cessation counseling when sensorium improves   DISCHARGE CONDITIONS:   Stable  CONSULTS OBTAINED:     PROCEDURES none  DRUG ALLERGIES:  No Known Allergies  DISCHARGE MEDICATIONS:   Allergies as of 10/08/2018   No Known Allergies     Medication List    TAKE these medications   amoxicillin-clavulanate 875-125 MG tablet Commonly known as:  AUGMENTIN Take 1 tablet by mouth every 12 (twelve) hours.   folic acid 1 MG tablet Commonly known as:  FOLVITE Take 1 tablet (1 mg total) by mouth daily. Start taking on:  October 09, 2018   ibuprofen 200 MG tablet Commonly known as:  ADVIL,MOTRIN Take 800 mg by mouth every 6 (six) hours as needed for fever, mild pain or moderate pain.   multivitamin with minerals Tabs tablet Take 1 tablet by mouth daily. Start taking on:  October 09, 2018   nicotine 14 mg/24hr patch Commonly known as:  NICODERM CQ - dosed in mg/24 hours Place 1 patch (14 mg total) onto the skin daily. Start taking on:  October 09, 2018   thiamine 100 MG tablet Take 1 tablet (100 mg total) by mouth daily. Start taking on:  October 09, 2018        DISCHARGE INSTRUCTIONS:   Follow-up with  primary care physician in 3 days Follow-up with drug rehab program as soon as possible in 1 -2day Outpatient follow-up with psychiatry/behavioral therapist in a week Outpatient follow-up with cardiology in 2 weeks  DIET:  Regular diet  DISCHARGE CONDITION:  Stable  ACTIVITY:  Activity as tolerated  OXYGEN:  Home Oxygen: No.   Oxygen Delivery: room air  DISCHARGE LOCATION:  home   If you experience worsening of your admission symptoms, develop shortness of breath, life threatening emergency, suicidal or homicidal thoughts you must seek medical attention immediately by calling 911 or calling your MD immediately  if symptoms less severe.  You Must read complete instructions/literature along with all the possible adverse reactions/side effects for all the Medicines you take and that have been prescribed to you. Take any new Medicines after you have completely understood and accpet all the possible adverse reactions/side effects.   Please note  You were cared for by a hospitalist during your hospital stay. If you have any questions about your discharge medications or the care you received while you were in the hospital after you are discharged, you can call the unit and asked to speak with the hospitalist on call if the hospitalist that took care of you is not available. Once you are discharged, your primary care physician will handle any further medical issues. Please note that NO REFILLS for any discharge medications will be authorized once you are discharged, as it is imperative that you return to your primary care physician (or establish a relationship with a primary care physician if you do not have one) for your aftercare needs so that they can reassess your need for medications and monitor your lab values.     Today  Chief Complaint  Patient presents with  . Drug Overdose   Patient is resting comfortably, did not understand why he still in the hospital and cannot wait to go  home.  Mom at bedside, agreeable to discharge patient and they do not want to wait for psychiatry consult to see the patient, would rather follow as an outpatient  ROS:  CONSTITUTIONAL: Denies fevers, chills. Denies any fatigue, weakness.  EYES: Denies blurry vision, double vision, eye pain. EARS, NOSE, THROAT: Denies tinnitus, ear pain, hearing loss. RESPIRATORY: Denies cough, wheeze, shortness of breath.  CARDIOVASCULAR: Denies chest pain, palpitations, edema.  GASTROINTESTINAL: Denies nausea, vomiting, diarrhea, abdominal pain. Denies bright red blood per rectum. GENITOURINARY: Denies dysuria, hematuria. ENDOCRINE: Denies nocturia or thyroid problems. HEMATOLOGIC AND LYMPHATIC: Denies easy bruising or bleeding. SKIN: Denies rash or lesion. MUSCULOSKELETAL: Denies pain in neck, back, shoulder, knees, hips or arthritic symptoms.  NEUROLOGIC: Denies paralysis, paresthesias.  PSYCHIATRIC: Denies anxiety or depressive symptoms.   VITAL SIGNS:  Blood pressure (!) 134/94, pulse 98, temperature 98.5 F (36.9 C), temperature source Oral, resp. rate (!) 21, height 5\' 10"  (1.778 m), weight 92.5 kg, SpO2 99 %.  I/O:    Intake/Output Summary (Last 24 hours) at 10/08/2018 0931 Last data filed at 10/08/2018 0600 Gross per 24 hour  Intake 3391.58 ml  Output 2250 ml  Net 1141.58 ml    PHYSICAL EXAMINATION:  GENERAL:  23 y.o.-year-old patient lying in the bed with no acute distress.  EYES: Pupils equal, round, reactive to light and accommodation. No scleral icterus. Extraocular  muscles intact.  HEENT: Head atraumatic, normocephalic. Oropharynx and nasopharynx clear.  NECK:  Supple, no jugular venous distention. No thyroid enlargement, no tenderness.  LUNGS: Normal breath sounds bilaterally, no wheezing, rales,rhonchi or crepitation. No use of accessory muscles of respiration.  CARDIOVASCULAR: S1, S2 normal. No murmurs, rubs, or gallops.  ABDOMEN: Soft, non-tender, non-distended. Bowel sounds  present.   EXTREMITIES: No pedal edema, cyanosis, or clubbing.  Multiple tattoos on extremities NEUROLOGIC: Cranial nerves II through XII are intact. Muscle strength 5/5 in all extremities. Sensation intact. Gait not checked.  PSYCHIATRIC: The patient is alert and oriented x 3.  SKIN: No obvious rash, lesion, or ulcer.   DATA REVIEW:   CBC Recent Labs  Lab 10/08/18 0347  WBC 11.2*  HGB 15.0  HCT 45.9  PLT 199    Chemistries  Recent Labs  Lab 10/08/18 0347  NA 139  K 3.5  CL 107  CO2 27  GLUCOSE 131*  BUN 8  CREATININE 0.74  CALCIUM 8.6*  AST 26  ALT 31  ALKPHOS 45  BILITOT 0.6    Cardiac Enzymes Recent Labs  Lab 10/07/18 1547  TROPONINI 0.05*    Microbiology Results  Results for orders placed or performed during the hospital encounter of 10/06/18  CULTURE, BLOOD (ROUTINE X 2) w Reflex to ID Panel     Status: None (Preliminary result)   Collection Time: 10/07/18  4:18 AM  Result Value Ref Range Status   Specimen Description BLOOD LEFT WRIST  Final   Special Requests   Final    BOTTLES DRAWN AEROBIC AND ANAEROBIC Blood Culture adequate volume   Culture   Final    NO GROWTH < 12 HOURS Performed at [facility], [address] [place], [place] [address]    Report Status PENDING  Incomplete  CULTURE, BLOOD (ROUTINE X 2) w Reflex to ID Panel     Status: None (Preliminary result)   Collection Time: 10/07/18  4:19 AM  Result Value Ref Range Status   Specimen Description BLOOD LEFT ARM  Final   Special Requests   Final    BOTTLES DRAWN AEROBIC AND ANAEROBIC Blood Culture results may not be optimal due to an excessive volume of blood received in culture bottles   Culture   Final    NO GROWTH < 12 HOURS Performed at [facility], [address] [place], [place] [address]    Report Status PENDING  Incomplete  MRSA PCR Screening     Status: None   Collection Time: 10/07/18  4:25 AM  Result Value Ref Range Status   MRSA by PCR NEGATIVE  NEGATIVE Final    Comment:        The GeneXpert MRSA Assay (FDA approved for NASAL specimens only), is one component of a comprehensive MRSA colonization surveillance program. It is not intended to diagnose MRSA infection nor to guide or monitor treatment for MRSA infections. Performed at [facility], [address] [place], [place] [address]     RADIOLOGY:  Dg Chest Port 1 View  Result Date: 10/07/2018 CLINICAL DATA:  Acute respiratory difficulty EXAM: PORTABLE CHEST 1 VIEW COMPARISON:  05/19/2016 FINDINGS: Cardiac shadow is mildly enlarged but accentuated by the portable technique. The left lung remains clear. The right lung now shows diffuse infiltrate throughout the lung but worst in the right upper lobe. No bony abnormality is noted. IMPRESSION: Patchy right-sided infiltrate worst in the upper lobe. Electronically Signed   By: Eulah PontMark  Lukens M.D.  On: 10/07/2018 03:18    EKG:   Orders placed or performed during the hospital encounter of 10/06/18  . EKG 12-Lead  . EKG 12-Lead      Management plans discussed with the patient, family and they are in agreement.  CODE STATUS:     Code Status Orders  (From admission, onward)         Start     Ordered   10/07/18 0341  Full code  Continuous     10/07/18 0340        Code Status History    This patient has a current code status but no historical code status.      TOTAL TIME TAKING CARE OF THIS PATIENT: 41  minutes.   Note: This dictation was prepared with Dragon dictation along with smaller phrase technology. Any transcriptional errors that result from this process are unintentional.   @[facility] @  on 10/08/2018 at 9:31 AM  Between 7am to 6pm - Pager - [phone]  After 6pm go to [email or web address] - password [email or web address]  [facility]  Office  [phone]  CC: Primary care physician; Patient, No Pcp Per

## 2018-10-08 NOTE — Clinical Social Work Note (Signed)
Clinical Social Work Assessment  Patient Details  Name: Raymond Cole MRN: [id number] Date of Birth: 13-Jan-1996  Date of referral:  10/08/18               Reason for consult:  Mental Health Concerns, Substance Use/ETOH Abuse                Permission sought to share information with:    Permission granted to share information::     Name::        Agency::     Relationship::     Contact Information:     Housing/Transportation Living arrangements for the past 2 months:  Single Family Home Source of Information:  Patient Patient Interpreter Needed:  None Criminal Activity/Legal Involvement Pertinent to Current Situation/Hospitalization:  No - Comment as needed Significant Relationships:  Parents, Significant Other Lives with:  Parents Do you feel safe going back to the place where you live?  Yes Need for family participation in patient care:  No (Coment)  Care giving concerns:  Patient lives in [place], [place] on the [place] with his mother Raymond Cole.    Social Worker assessment / plan:  Holiday representative ([place]) received consult to provide outpatient substance abuse resources for patient. CSW met with patient alone at bedside. Patient's mother was at bedside and stepped out of the room during assessment. Patient was alert and oriented X4 and was sitting up in the bed. CSW introduced self and explained role of CSW department. Per patient he works full time in [place] and has his own car for transportation. Per patient he lives with his mother and is engaged to his fiance who he reported his several years older then him. Per patient he has no children however his fiance has older children. Patient reported that he has not used opioids in 7 months and has been clean until a few days ago. Patient reported that he used heroin via injection because of a comment that someone made to him. Patient would not go into details about the comment. Patient reported that he knew that the  heroin probably had fentanyl in it which is why he overdosed. Per patient he went to take a shower used heroin and the next thing he remembers was laying on the floor of the laundry room with 25 people around him screaming. Patient reported that he has never overdosed before. CSW provided emotional support. Per patient he was not going to outpatient substance abuse treatment for the past 7 months because he was doing it on his own. Per patient his mother and fiance are very supportive and helped him stay clean. Patient reported that he does not care about himself but he does care about other people. Patient reported that he does not have thoughts of hurting himself. CSW provided emotional support and outpatient substance abuse resources list including [facility]. CSW made patient aware that [facility] takes walks in Mondays, Wednesdays and Fridays. CSW contacted [facility] and they said they do not take appointments. CSW also provided patient with suboxone clinic information for [place] at his request. Per patient he has not been diagnosed with a mental health disorder however he suspects he has depression. CSW explained that [facility] is a mental health and substance abuse clinic that can treat him for both disorders. Patient verbalized his understanding and thanked CSW for visit. Please reconsult if future social work needs rise. CSW signing off.   Employment status:  Therapist, music:  [employer]  Recommendations:  Not assessed at this time Information / Referral to community resources:  Outpatient Substance Abuse Treatment Options  Patient/Family's Response to care:  Patient accepted outpatient substance abuse resources.   Patient/Family's Understanding of and Emotional Response to Diagnosis, Current Treatment, and Prognosis:  Patient was guarded with his answers during assessment.   Emotional Assessment Appearance:  Appears stated age Attitude/Demeanor/Rapport:    Affect (typically  observed):  Accepting, Adaptable, Pleasant Orientation:  Oriented to Self, Oriented to Place, Oriented to  Time, Oriented to Situation Alcohol / Substance use:  Illicit Drugs Psych involvement (Current and /or in the community):  No (Comment)  Discharge Needs  Concerns to be addressed:  No discharge needs identified Readmission within the last 30 days:  No Current discharge risk:  Substance Abuse Barriers to Discharge:  No Barriers Identified   Raymond Cole, Raymond Cole, Raymond Cole 10/08/2018, 3:20 PM

## 2018-10-08 NOTE — Plan of Care (Signed)

## 2018-10-12 LAB — CULTURE, BLOOD (ROUTINE X 2)
Culture: NO GROWTH
Culture: NO GROWTH
Special Requests: ADEQUATE

## 2018-12-09 IMAGING — DX DG FOREARM 2V*R*
2 series · 2 of 2 positions shown · non-contrast
Comparison: Study obtained earlier in the day

CLINICAL DATA: Injury with radiopaque foreign bodies in distal
forearm region

EXAM:
RIGHT FOREARM - 2 VIEW

[forearm ap]
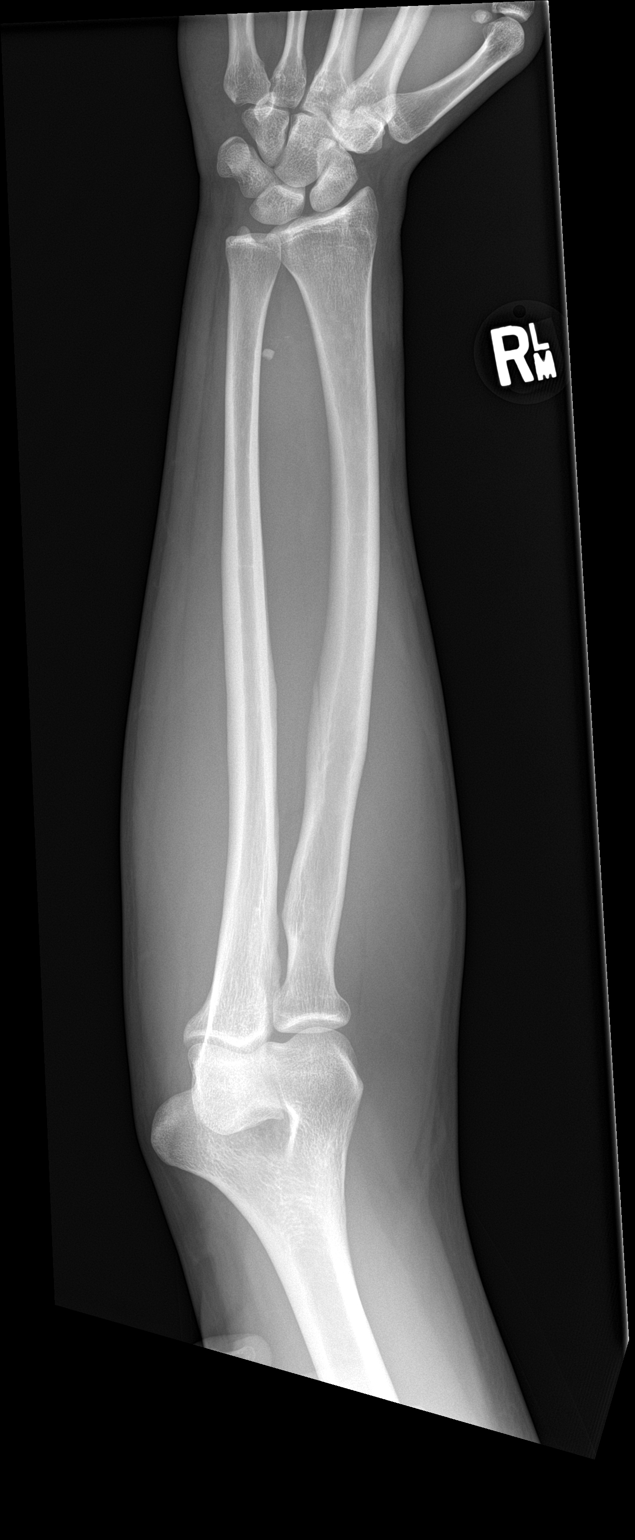

[forearm lat]
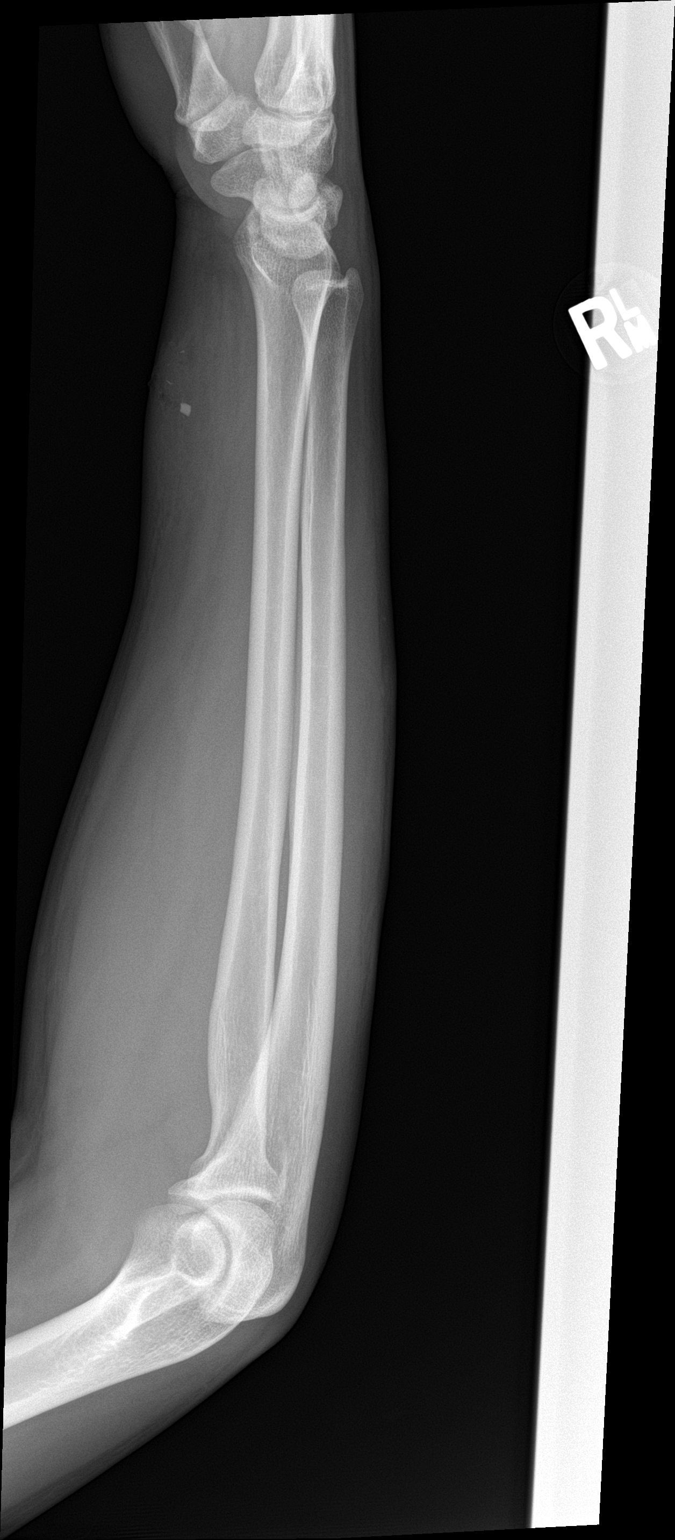

[2 of 2 positions shown; findings below may reference images not displayed]

FINDINGS: Frontal and lateral views were obtained. There are fewer radiopaque
foreign bodies in the volar aspect of the distal forearm compared to
earlier in the day. Specifically, there is a the radiopaque foreign
body measuring 3 x 3 mm with at least 4 1 mm or less foreign bodies
in this area. No new radiopaque foreign body. Small amount of soft
tissue air evident in the area of recent foreign body removal. No
fracture or dislocation. No appreciable arthropathy.
IMPRESSION: Fewer radiopaque foreign bodies in the volar aspect of the distal
forearm. Several small residual radiopaque foreign bodies remain.
Largest of these remaining foreign bodies measures 3 x 3 mm. No bony
abnormality.

## 2019-02-02 ENCOUNTER — Other Ambulatory Visit: Payer: Self-pay

## 2019-02-02 ENCOUNTER — Emergency Department
Admission: EM | Admit: 2019-02-02 | Discharge: 2019-02-03 | Disposition: A | Discharge status: Home / Self Care | Payer: BC Managed Care – PPO | Attending: Emergency Medicine | Admitting: Emergency Medicine

## 2019-02-02 DIAGNOSIS — T402X4A Poisoning by other opioids, undetermined, initial encounter: Secondary | ICD-10-CM | POA: Insufficient documentation

## 2019-02-02 DIAGNOSIS — F1110 Opioid abuse, uncomplicated: Secondary | ICD-10-CM | POA: Diagnosis present

## 2019-02-02 DIAGNOSIS — F1910 Other psychoactive substance abuse, uncomplicated: Secondary | ICD-10-CM | POA: Diagnosis present

## 2019-02-02 DIAGNOSIS — T40601A Poisoning by unspecified narcotics, accidental (unintentional), initial encounter: Secondary | ICD-10-CM

## 2019-02-02 DIAGNOSIS — F419 Anxiety disorder, unspecified: Secondary | ICD-10-CM | POA: Insufficient documentation

## 2019-02-02 DIAGNOSIS — F17210 Nicotine dependence, cigarettes, uncomplicated: Secondary | ICD-10-CM | POA: Diagnosis not present

## 2019-02-02 DIAGNOSIS — T50904A Poisoning by unspecified drugs, medicaments and biological substances, undetermined, initial encounter: Secondary | ICD-10-CM

## 2019-02-02 DIAGNOSIS — T40604A Poisoning by unspecified narcotics, undetermined, initial encounter: Secondary | ICD-10-CM

## 2019-02-02 LAB — CBC WITH DIFFERENTIAL/PLATELET
Abs Immature Granulocytes: 0.01 10*3/uL (ref 0.00–0.07)
Basophils Absolute: 0 10*3/uL (ref 0.0–0.1)
Basophils Relative: 1 %
Eosinophils Absolute: 0.3 10*3/uL (ref 0.0–0.5)
Eosinophils Relative: 5 %
HCT: 38.5 % — ABNORMAL LOW (ref 39.0–52.0)
Hemoglobin: 12.9 g/dL — ABNORMAL LOW (ref 13.0–17.0)
Immature Granulocytes: 0 %
Lymphocytes Relative: 34 %
Lymphs Abs: 1.9 10*3/uL (ref 0.7–4.0)
MCH: 32.6 pg (ref 26.0–34.0)
MCHC: 33.5 g/dL (ref 30.0–36.0)
MCV: 97.2 fL (ref 80.0–100.0)
Monocytes Absolute: 0.5 10*3/uL (ref 0.1–1.0)
Monocytes Relative: 8 %
Neutro Abs: 3 10*3/uL (ref 1.7–7.7)
Neutrophils Relative %: 52 %
Platelets: 208 10*3/uL (ref 150–400)
RBC: 3.96 MIL/uL — ABNORMAL LOW (ref 4.22–5.81)
RDW: 11.2 % — ABNORMAL LOW (ref 11.5–15.5)
WBC: 5.6 10*3/uL (ref 4.0–10.5)
nRBC: 0 % (ref 0.0–0.2)

## 2019-02-02 LAB — COMPREHENSIVE METABOLIC PANEL
ALT: 94 U/L — ABNORMAL HIGH (ref 0–44)
AST: 62 U/L — ABNORMAL HIGH (ref 15–41)
Albumin: 3.7 g/dL (ref 3.5–5.0)
Alkaline Phosphatase: 64 U/L (ref 38–126)
Anion gap: 9 (ref 5–15)
BUN: 14 mg/dL (ref 6–20)
CO2: 27 mmol/L (ref 22–32)
Calcium: 9 mg/dL (ref 8.9–10.3)
Chloride: 103 mmol/L (ref 98–111)
Creatinine, Ser: 1.02 mg/dL (ref 0.61–1.24)
GFR calc Af Amer: >60 mL/min (ref >60)
GFR calc non Af Amer: >60 mL/min (ref >60)
Glucose, Bld: 123 mg/dL — ABNORMAL HIGH (ref 70–99)
Potassium: 3.7 mmol/L (ref 3.5–5.1)
Sodium: 139 mmol/L (ref 135–145)
Total Bilirubin: 0.5 mg/dL (ref 0.3–1.2)
Total Protein: 6.4 g/dL — ABNORMAL LOW (ref 6.5–8.1)

## 2019-02-02 LAB — ETHANOL: Alcohol, Ethyl (B): <10 mg/dL (ref <10)

## 2019-02-02 LAB — ACETAMINOPHEN LEVEL: Acetaminophen (Tylenol), Serum: <10 ug/mL — ABNORMAL LOW (ref 10–30)

## 2019-02-02 LAB — SALICYLATE LEVEL: Salicylate Lvl: <7 mg/dL (ref 2.8–30.0)

## 2019-02-02 NOTE — ED Notes (Signed)
Pt asleep in bed.

## 2019-02-02 NOTE — ED Notes (Signed)
Pt remains asleep. Chest visibly rising and falling.

## 2019-02-02 NOTE — ED Notes (Signed)
Pt using restroom. Pt continues to deny this RN a urine sample.

## 2019-02-02 NOTE — ED Notes (Signed)
Pt was advised that Dr. Owens Shark wants to speak with his fiancee when she comes to pick him up. Pt is pacing and on the phone, sometimes raising his voice. Pt was advised that other pts are sleeping and he agreed to maintain a lower volume.  Pt remains polite. Will monitor for needs/safety. Dr. Owens Shark is now in the room speaking with the pt.

## 2019-02-02 NOTE — ED Notes (Addendum)
Pt given graham crackers and peanut butter.

## 2019-02-02 NOTE — ED Notes (Signed)
Pt requesting to use a phone again. Pt has spoken with family over phone multiple times today. Pt has personal phone to use briefly with security at bedside.

## 2019-02-02 NOTE — ED Notes (Signed)
Pt states he's still hungry, pt given sandwich tray. No other needs voiced at this time. Will continue to monitor Q15 minute rounds.

## 2019-02-02 NOTE — ED Notes (Signed)
Report given to psych RN

## 2019-02-02 NOTE — ED Notes (Signed)
[name] NT checked on pt in restroom. Pt states not done yet.

## 2019-02-02 NOTE — ED Notes (Signed)
NT to assist to get pt dressed out.

## 2019-02-02 NOTE — ED Notes (Signed)
Pt turned self prone while sleeping and began to make agonal-like breath sounds. Woke pt and educated to turn onto side or back while sleeping. Pt woke easily and turn self to back.
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
## 2019-02-02 NOTE — ED Notes (Signed)
Will collect EKG etc once pt out of restroom.

## 2019-02-02 NOTE — ED Notes (Signed)
Pt talking on phone with family briefly.

## 2019-02-02 NOTE — ED Notes (Signed)
Pt using restroom. Educated to provide urine sample into specimen cup given to pt.

## 2019-02-02 NOTE — ED Notes (Signed)
Officer at bedside.

## 2019-02-02 NOTE — ED Notes (Signed)
Pt frustrated with IVC order; EDP Malinda spoke with pt about this; pt worried about ride home. Security officer talking with pt.

## 2019-02-02 NOTE — ED Notes (Signed)
Pt on phone with family attempting to get someone to go pick his car up.

## 2019-02-02 NOTE — ED Notes (Signed)
Psych staff at bedside to provide eval.

## 2019-02-02 NOTE — ED Notes (Signed)
Pt's fiance talking with this RN telling this RN she is extremely concerned for pt due to several recent ODs. States she had to narcan him yesterday due to OD. States pt also cuts self; scars on chest per fiance are due to pt cutting self with knife.
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
## 2019-02-03 DIAGNOSIS — F1910 Other psychoactive substance abuse, uncomplicated: Secondary | ICD-10-CM

## 2019-02-03 NOTE — ED Provider Notes (Signed)
23 year old male presenting to the emergency department following heroin overdose.  Dr. Cinda Quest recommended that the patient be discharged home if someone was willing to take the patient home.  Patient's girlfriend presented to the emergency department and he was released in her custody.  However I did speak with the patient and offered detox to the patient after he was evaluated by the psychiatry staff.  Patient adamantly refused going to detox.  I offered to prescribe Narcan to the patient for home to which she responded "I already have it".  Patient denied any suicidal ideation.  Patient denied any homicidal ideation.   Gregor Hams, MD 02/03/19 [phone]

## 2019-02-03 NOTE — ED Notes (Signed)
Pt was discharged with all belongings. He appeared in no acute distress, with normal gait. This Probation officer met his Raymond Cole, who arrived to take him home.
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
## 2019-08-22 ENCOUNTER — Encounter: Payer: Self-pay | Admitting: Emergency Medicine

## 2019-08-22 ENCOUNTER — Emergency Department
Admission: EM | Admit: 2019-08-22 | Discharge: 2019-08-22 | Disposition: A | Discharge status: Home / Self Care | Payer: BC Managed Care – PPO | Attending: Student in an Organized Health Care Education/Training Program | Admitting: Student in an Organized Health Care Education/Training Program

## 2019-08-22 DIAGNOSIS — F17210 Nicotine dependence, cigarettes, uncomplicated: Secondary | ICD-10-CM | POA: Diagnosis not present

## 2019-08-22 DIAGNOSIS — T401X1A Poisoning by heroin, accidental (unintentional), initial encounter: Secondary | ICD-10-CM | POA: Insufficient documentation

## 2019-08-22 NOTE — ED Notes (Signed)
Pt alert and laying calmly in bed. Denies thoughts/plans/intentions to harm self. A&Ox4. States minimal CP/abd pain.
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
## 2019-08-22 NOTE — ED Notes (Signed)
Pt refusing to sign discharge says his ride is here and he cant wait. Pts IV removed by this RN in hallway. Pt handed DC paperwork

## 2019-08-22 NOTE — ED Notes (Signed)
Pt up to bathroom with slow, steady gait. No assistance needed. Pt unable to urinate at this time.

## 2019-08-22 NOTE — ED Provider Notes (Signed)
[facility] Department Provider Note    First MD Initiated Contact with Patient 08/22/19 1915     (approximate)  I have reviewed the triage vital signs and the nursing notes.   HISTORY  Chief Complaint Drug Overdose    HPI DONALDSON RICHTER is a 23 y.o. male with the below listed past medical history with previous narcotic overdose presents to the ER via EMS after being found unresponsive after recreational overdose of heroin today.  He did receive intranasal Narcan.  States has been clean since July.  States he was feeling frustrated and "just wanted to get high."      Past Medical History:  Diagnosis Date  . Anxiety    NO MEDS  . Pneumonia    H/O   History reviewed. No pertinent family history. Past Surgical History:  Procedure Laterality Date  . MOUTH SURGERY     Patient Active Problem List   Diagnosis Date Noted  . Drug abuse ([place]) 02/03/2019  . Drug overdose 10/07/2018  . 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 Alcohol intoxication ([place]) 03/04/2017      Prior to Admission medications   Medication Sig Start Date End Date Taking? Authorizing Provider  amoxicillin-clavulanate (AUGMENTIN) 875-125 MG tablet Take 1 tablet by mouth every 12 (twelve) hours. Patient not taking: Reported on 02/02/2019 10/08/18   Nicholes Mango, MD  folic acid (FOLVITE) 1 MG tablet Take 1 tablet (1 mg total) by mouth daily. Patient not taking: Reported on 02/02/2019 10/09/18   Nicholes Mango, MD  ibuprofen (ADVIL,MOTRIN) 200 MG tablet Take 800 mg by mouth every 6 (six) hours as needed for fever, mild pain or moderate pain.    [provider]  Multiple Vitamin (MULTIVITAMIN WITH MINERALS) TABS tablet Take 1 tablet by mouth daily. Patient not taking: Reported on 02/02/2019 10/09/18   Nicholes Mango, MD  nicotine (NICODERM CQ - DOSED IN MG/24 HOURS) 14 mg/24hr patch Place 1 patch (14 mg total) onto the skin daily. Patient not taking: Reported on  02/02/2019 10/09/18   Nicholes Mango, MD  thiamine 100 MG tablet Take 1 tablet (100 mg total) by mouth daily. Patient not taking: Reported on 02/02/2019 10/09/18   Nicholes Mango, MD    Allergies Patient has no known allergies.    Social History Social History   Tobacco Use  . Smoking status: Current Every Day Smoker    Packs/day: 1.00    Years: 5.00    Pack years: 5.00    Types: Cigarettes  . Smokeless tobacco: Never Used  Substance Use Topics  . Alcohol use: Yes    Comment: rare  . Drug use: Yes    Types: Marijuana, Cocaine, Benzodiazepines    Comment: +UDS ON 03-04-17 FOR COCAINE, MARIJUANA  AND BENZO'S    Review of Systems Patient denies headaches, rhinorrhea, blurry vision, numbness, shortness of breath, chest pain, edema, cough, abdominal pain, nausea, vomiting, diarrhea, dysuria, fevers, rashes or hallucinations unless otherwise stated above in HPI. ____________________________________________   PHYSICAL EXAM:  VITAL SIGNS: Vitals:   08/22/19 1915 08/22/19 1948  BP: (!) 148/97 (!) 144/99  Pulse: (!) 102 (!) 105  Resp: 18   Temp: 98.8 F (37.1 C)   SpO2: 97% 98%    Constitutional: Alert and oriented.  Eyes: Conjunctivae are normal.  Head: Atraumatic. Nose: No congestion/rhinnorhea. Mouth/Throat: Mucous membranes are moist.   Neck: No stridor. Painless ROM.  Cardiovascular: Normal rate, regular rhythm. Grossly normal heart sounds.  Good peripheral circulation. Respiratory:  Normal respiratory effort.  No retractions. Lungs CTAB. Gastrointestinal: Soft and nontender. No distention. No abdominal bruits. No CVA tenderness. Genitourinary:  Musculoskeletal: No lower extremity tenderness nor edema.  No joint effusions. Neurologic:  Normal speech and language. No gross focal neurologic deficits are appreciated. No facial droop Skin:  Skin is warm, dry and intact. No rash noted. Psychiatric: Mood and affect are normal. Speech and behavior are  normal.  ____________________________________________   LABS (all labs ordered are listed, but only abnormal results are displayed)  No results found for this or any previous visit (from the past 24 hour(s)). ____________________________________________  EKG My review and personal interpretation at Time: 19:21   Indication: od  Rate: 90  Rhythm: sinus Axis: normal Other: normal intervals, no stemi ____________________________________________  RADIOLOGY   ____________________________________________   PROCEDURES  Procedure(s) performed:  Procedures    Critical Care performed: no ____________________________________________   INITIAL IMPRESSION / ASSESSMENT AND PLAN / ED COURSE  Pertinent labs & imaging results that were available during my care of the patient were reviewed by me and considered in my medical decision making (see chart for details).   DDX: od, accidental od, intentional of  VEASNA SANTIBANEZ is a 23 y.o. who presents to the ED with overdose as described above requiring Narcan.  Patient will be observed in the ER.  Does not sound like an intentional overdose.  Does not have any SI or HI.  No criteria for IVC.  Clinical Course as of Aug 21 2144  Mon Aug 22, 2019  2127 Patient hemodynamically stable.  Appears clinically sober.  Does not require any additional doses of Narcan.  This was a recreational use and not an intentional overdose.  He is clinically stable for discharge home.   [PR]    Clinical Course User Index [PR] Willy Eddy, MD    The patient was evaluated in Emergency Department today for the symptoms described in the history of present illness. He/she was evaluated in the context of the global COVID-19 pandemic, which necessitated consideration that the patient might be at risk for infection with the SARS-CoV-2 virus that causes COVID-19. Institutional protocols and algorithms that pertain to the evaluation of patients at risk for  COVID-19 are in a state of rapid change based on information released by regulatory bodies including the CDC and federal and state organizations. These policies and algorithms were followed during the patient's care in the ED.  As part of my medical decision making, I reviewed the following data within the electronic MEDICAL RECORD NUMBER Nursing notes reviewed and incorporated, Labs reviewed, notes from prior ED visits and [place] Controlled Substance Database   ____________________________________________   FINAL CLINICAL IMPRESSION(S) / ED DIAGNOSES  Final diagnoses:  Accidental overdose of heroin, initial encounter (HCC)      NEW MEDICATIONS STARTED DURING THIS VISIT:  New Prescriptions   No medications on file     Note:  This document was prepared using Dragon voice recognition software and may include unintentional dictation errors.    Willy Eddy, MD 08/22/19 2145

## 2019-11-28 ENCOUNTER — Emergency Department: Payer: BC Managed Care – PPO

## 2019-11-28 ENCOUNTER — Inpatient Hospital Stay: Payer: BC Managed Care – PPO

## 2019-11-28 ENCOUNTER — Encounter: Payer: Self-pay | Admitting: *Deleted

## 2019-11-28 ENCOUNTER — Other Ambulatory Visit: Payer: Self-pay

## 2019-11-28 ENCOUNTER — Inpatient Hospital Stay
Admission: EM | Admit: 2019-11-28 | Discharge: 2019-11-30 | DRG: 871 | Discharge status: Against Medical Advice | Payer: BC Managed Care – PPO | Attending: Family Medicine | Admitting: Family Medicine

## 2019-11-28 DIAGNOSIS — Z7151 Drug abuse counseling and surveillance of drug abuser: Secondary | ICD-10-CM

## 2019-11-28 DIAGNOSIS — R451 Restlessness and agitation: Secondary | ICD-10-CM | POA: Diagnosis not present

## 2019-11-28 DIAGNOSIS — Z79899 Other long term (current) drug therapy: Secondary | ICD-10-CM | POA: Diagnosis not present

## 2019-11-28 DIAGNOSIS — F17210 Nicotine dependence, cigarettes, uncomplicated: Secondary | ICD-10-CM | POA: Diagnosis present

## 2019-11-28 DIAGNOSIS — F1123 Opioid dependence with withdrawal: Secondary | ICD-10-CM | POA: Diagnosis not present

## 2019-11-28 DIAGNOSIS — F1510 Other stimulant abuse, uncomplicated: Secondary | ICD-10-CM | POA: Diagnosis present

## 2019-11-28 DIAGNOSIS — L03113 Cellulitis of right upper limb: Secondary | ICD-10-CM | POA: Diagnosis present

## 2019-11-28 DIAGNOSIS — Z20822 Contact with and (suspected) exposure to covid-19: Secondary | ICD-10-CM | POA: Diagnosis present

## 2019-11-28 DIAGNOSIS — A419 Sepsis, unspecified organism: Secondary | ICD-10-CM | POA: Diagnosis present

## 2019-11-28 DIAGNOSIS — Z781 Physical restraint status: Secondary | ICD-10-CM | POA: Diagnosis not present

## 2019-11-28 DIAGNOSIS — F1990 Other psychoactive substance use, unspecified, uncomplicated: Secondary | ICD-10-CM

## 2019-11-28 DIAGNOSIS — F1910 Other psychoactive substance abuse, uncomplicated: Secondary | ICD-10-CM | POA: Diagnosis present

## 2019-11-28 DIAGNOSIS — G92 Toxic encephalopathy: Secondary | ICD-10-CM | POA: Diagnosis present

## 2019-11-28 DIAGNOSIS — M65831 Other synovitis and tenosynovitis, right forearm: Secondary | ICD-10-CM | POA: Diagnosis present

## 2019-11-28 DIAGNOSIS — L818 Other specified disorders of pigmentation: Secondary | ICD-10-CM | POA: Diagnosis not present

## 2019-11-28 DIAGNOSIS — Z7289 Other problems related to lifestyle: Secondary | ICD-10-CM | POA: Diagnosis not present

## 2019-11-28 DIAGNOSIS — F1513 Other stimulant abuse with withdrawal: Secondary | ICD-10-CM | POA: Diagnosis not present

## 2019-11-28 DIAGNOSIS — F1113 Opioid abuse with withdrawal: Secondary | ICD-10-CM | POA: Diagnosis not present

## 2019-11-28 DIAGNOSIS — Z915 Personal history of self-harm: Secondary | ICD-10-CM

## 2019-11-28 LAB — URINE DRUG SCREEN, QUALITATIVE (ARMC ONLY)
Amphetamines, Ur Screen: POSITIVE — AB
Barbiturates, Ur Screen: NOT DETECTED
Benzodiazepine, Ur Scrn: NOT DETECTED
Cannabinoid 50 Ng, Ur ~~LOC~~: NOT DETECTED
Cocaine Metabolite,Ur ~~LOC~~: NOT DETECTED
MDMA (Ecstasy)Ur Screen: NOT DETECTED
Methadone Scn, Ur: NOT DETECTED
Opiate, Ur Screen: POSITIVE — AB
Phencyclidine (PCP) Ur S: NOT DETECTED
Tricyclic, Ur Screen: NOT DETECTED

## 2019-11-28 LAB — URINALYSIS, COMPLETE (UACMP) WITH MICROSCOPIC
Bacteria, UA: NONE SEEN
Bilirubin Urine: NEGATIVE
Glucose, UA: NEGATIVE mg/dL
Hgb urine dipstick: NEGATIVE
Ketones, ur: NEGATIVE mg/dL
Leukocytes,Ua: NEGATIVE
Nitrite: NEGATIVE
Protein, ur: NEGATIVE mg/dL
Specific Gravity, Urine: 1.006 (ref 1.005–1.030)
Squamous Epithelial / HPF: NONE SEEN (ref 0–5)
pH: 5 (ref 5.0–8.0)

## 2019-11-28 LAB — COMPREHENSIVE METABOLIC PANEL
ALT: 27 U/L (ref 0–44)
AST: 29 U/L (ref 15–41)
Albumin: 3.5 g/dL (ref 3.5–5.0)
Alkaline Phosphatase: 64 U/L (ref 38–126)
Anion gap: 11 (ref 5–15)
BUN: 17 mg/dL (ref 6–20)
CO2: 25 mmol/L (ref 22–32)
Calcium: 8.5 mg/dL — ABNORMAL LOW (ref 8.9–10.3)
Chloride: 95 mmol/L — ABNORMAL LOW (ref 98–111)
Creatinine, Ser: 1.05 mg/dL (ref 0.61–1.24)
GFR calc Af Amer: >60 mL/min (ref >60)
GFR calc non Af Amer: >60 mL/min (ref >60)
Glucose, Bld: 122 mg/dL — ABNORMAL HIGH (ref 70–99)
Potassium: 3.6 mmol/L (ref 3.5–5.1)
Sodium: 131 mmol/L — ABNORMAL LOW (ref 135–145)
Total Bilirubin: 0.6 mg/dL (ref 0.3–1.2)
Total Protein: 7 g/dL (ref 6.5–8.1)

## 2019-11-28 LAB — CBC WITH DIFFERENTIAL/PLATELET
Abs Immature Granulocytes: 0.25 10*3/uL — ABNORMAL HIGH (ref 0.00–0.07)
Basophils Absolute: 0.1 10*3/uL (ref 0.0–0.1)
Basophils Relative: 0 %
Eosinophils Absolute: 0.1 10*3/uL (ref 0.0–0.5)
Eosinophils Relative: 0 %
HCT: 35.8 % — ABNORMAL LOW (ref 39.0–52.0)
Hemoglobin: 11.9 g/dL — ABNORMAL LOW (ref 13.0–17.0)
Immature Granulocytes: 1 %
Lymphocytes Relative: 5 %
Lymphs Abs: 1.3 10*3/uL (ref 0.7–4.0)
MCH: 31.2 pg (ref 26.0–34.0)
MCHC: 33.2 g/dL (ref 30.0–36.0)
MCV: 94 fL (ref 80.0–100.0)
Monocytes Absolute: 1.3 10*3/uL — ABNORMAL HIGH (ref 0.1–1.0)
Monocytes Relative: 6 %
Neutro Abs: 20.1 10*3/uL — ABNORMAL HIGH (ref 1.7–7.7)
Neutrophils Relative %: 88 %
Platelets: 263 10*3/uL (ref 150–400)
RBC: 3.81 MIL/uL — ABNORMAL LOW (ref 4.22–5.81)
RDW: 11.7 % (ref 11.5–15.5)
WBC: 23 10*3/uL — ABNORMAL HIGH (ref 4.0–10.5)
nRBC: 0 % (ref 0.0–0.2)

## 2019-11-28 LAB — SARS CORONAVIRUS 2 (TAT 6-24 HRS): SARS Coronavirus 2: NEGATIVE

## 2019-11-28 LAB — HIV ANTIBODY (ROUTINE TESTING W REFLEX): HIV Screen 4th Generation wRfx: NONREACTIVE

## 2019-11-28 LAB — ETHANOL: Alcohol, Ethyl (B): <10 mg/dL (ref <10)

## 2019-11-28 LAB — PROTIME-INR
INR: 1.1 (ref 0.8–1.2)
Prothrombin Time: 14.1 seconds (ref 11.4–15.2)

## 2019-11-28 LAB — LACTIC ACID, PLASMA: Lactic Acid, Venous: 1.1 mmol/L (ref 0.5–1.9)

## 2019-11-28 MED ORDER — ACETAMINOPHEN 650 MG RE SUPP: 650.0000 mg | Freq: Four times a day (QID) | RECTAL | Status: DC | PRN

## 2019-11-28 MED ORDER — ONDANSETRON HCL 4 MG PO TABS: 4.0000 mg | ORAL_TABLET | Freq: Four times a day (QID) | ORAL | Status: DC | PRN

## 2019-11-28 MED ORDER — MORPHINE SULFATE (PF) 2 MG/ML IV SOLN
INTRAVENOUS | Status: AC
  Filled 2019-11-28: qty 1

## 2019-11-28 MED ORDER — HALOPERIDOL LACTATE 5 MG/ML IJ SOLN
INTRAMUSCULAR | Status: AC
  Filled 2019-11-28: qty 1

## 2019-11-28 MED ORDER — VANCOMYCIN HCL 1500 MG/300ML IV SOLN
1500.0000 mg | Freq: Two times a day (BID) | INTRAVENOUS | Status: DC
  Administered 2019-11-28 – 2019-11-29 (×3): 1500 mg via INTRAVENOUS
  Filled 2019-11-28 (×5): qty 300

## 2019-11-28 MED ORDER — HALOPERIDOL LACTATE 5 MG/ML IJ SOLN
2.0000 mg | Freq: Once | INTRAMUSCULAR | Status: AC
  Administered 2019-11-28: 2 mg via INTRAVENOUS

## 2019-11-28 MED ORDER — ENOXAPARIN SODIUM 40 MG/0.4ML ~~LOC~~ SOLN
40.0000 mg | SUBCUTANEOUS | Status: DC
  Administered 2019-11-29: 40 mg via SUBCUTANEOUS
  Filled 2019-11-28 (×2): qty 0.4

## 2019-11-28 MED ORDER — SODIUM CHLORIDE 0.9 % IV SOLN: INTRAVENOUS | Status: DC

## 2019-11-28 MED ORDER — LORAZEPAM 2 MG/ML IJ SOLN
2.0000 mg | Freq: Once | INTRAMUSCULAR | Status: AC
  Administered 2019-11-28: 2 mg via INTRAVENOUS
  Filled 2019-11-28: qty 1

## 2019-11-28 MED ORDER — VANCOMYCIN HCL IN DEXTROSE 1-5 GM/200ML-% IV SOLN
1000.0000 mg | Freq: Once | INTRAVENOUS | Status: AC
  Administered 2019-11-28: 1000 mg via INTRAVENOUS
  Filled 2019-11-28: qty 200

## 2019-11-28 MED ORDER — LORAZEPAM 1 MG PO TABS: 1.0000 mg | ORAL_TABLET | ORAL | Status: DC | PRN

## 2019-11-28 MED ORDER — ONDANSETRON HCL 4 MG/2ML IJ SOLN: 4.0000 mg | Freq: Four times a day (QID) | INTRAMUSCULAR | Status: DC | PRN

## 2019-11-28 MED ORDER — LACTATED RINGERS IV BOLUS
1000.0000 mL | Freq: Once | INTRAVENOUS | Status: AC
  Administered 2019-11-28: 1000 mL via INTRAVENOUS

## 2019-11-28 MED ORDER — SODIUM CHLORIDE 0.9 % IV SOLN
2.0000 g | Freq: Once | INTRAVENOUS | Status: AC
  Administered 2019-11-28: 2 g via INTRAVENOUS
  Filled 2019-11-28: qty 20

## 2019-11-28 MED ORDER — FOLIC ACID 1 MG PO TABS: 1.0000 mg | ORAL_TABLET | Freq: Every day | ORAL | Status: DC

## 2019-11-28 MED ORDER — ACETAMINOPHEN 325 MG PO TABS
650.0000 mg | ORAL_TABLET | Freq: Four times a day (QID) | ORAL | Status: DC | PRN
  Administered 2019-11-28: 650 mg via ORAL
  Filled 2019-11-28: qty 2

## 2019-11-28 MED ORDER — LORAZEPAM 2 MG/ML IJ SOLN: 2.0000 mg | Freq: Once | INTRAMUSCULAR | Status: AC

## 2019-11-28 MED ORDER — VANCOMYCIN HCL IN DEXTROSE 1-5 GM/200ML-% IV SOLN: 1000.0000 mg | Freq: Once | INTRAVENOUS | Status: DC

## 2019-11-28 MED ORDER — PIPERACILLIN-TAZOBACTAM 3.375 G IVPB
3.3750 g | Freq: Three times a day (TID) | INTRAVENOUS | Status: DC
  Administered 2019-11-29 (×3): 3.375 g via INTRAVENOUS
  Filled 2019-11-28 (×3): qty 50

## 2019-11-28 MED ORDER — LORAZEPAM 2 MG/ML IJ SOLN
2.0000 mg | Freq: Four times a day (QID) | INTRAMUSCULAR | Status: DC | PRN
  Administered 2019-11-28: 2 mg via INTRAVENOUS
  Filled 2019-11-28: qty 1

## 2019-11-28 MED ORDER — SODIUM CHLORIDE 0.9 % IV BOLUS
1000.0000 mL | Freq: Once | INTRAVENOUS | Status: AC
  Administered 2019-11-28: 1000 mL via INTRAVENOUS

## 2019-11-28 MED ORDER — MORPHINE SULFATE (PF) 4 MG/ML IV SOLN
4.0000 mg | Freq: Once | INTRAVENOUS | Status: AC
  Administered 2019-11-28: 4 mg via INTRAVENOUS
  Filled 2019-11-28: qty 1

## 2019-11-28 MED ORDER — LORAZEPAM 2 MG/ML IJ SOLN
INTRAMUSCULAR | Status: AC
  Administered 2019-11-28: 16:00:00 2 mg via INTRAVENOUS
  Filled 2019-11-28: qty 1

## 2019-11-28 MED ORDER — THIAMINE HCL 100 MG/ML IJ SOLN
100.0000 mg | Freq: Every day | INTRAMUSCULAR | Status: DC
  Filled 2019-11-28: qty 2

## 2019-11-28 MED ORDER — ADULT MULTIVITAMIN W/MINERALS CH
1.0000 | ORAL_TABLET | Freq: Every day | ORAL | Status: DC
  Administered 2019-11-29 – 2019-11-30 (×2): 1 via ORAL
  Filled 2019-11-28 (×2): qty 1

## 2019-11-28 MED ORDER — MORPHINE SULFATE (PF) 2 MG/ML IV SOLN
2.0000 mg | INTRAVENOUS | Status: DC | PRN
  Administered 2019-11-28 – 2019-11-30 (×3): 2 mg via INTRAVENOUS
  Filled 2019-11-28 (×2): qty 1

## 2019-11-28 MED ORDER — PIPERACILLIN-TAZOBACTAM 3.375 G IVPB 30 MIN
3.3750 g | Freq: Once | INTRAVENOUS | Status: AC
  Administered 2019-11-28: 11:00:00 3.375 g via INTRAVENOUS
  Filled 2019-11-28: qty 50

## 2019-11-28 MED ORDER — THIAMINE HCL 100 MG PO TABS
100.0000 mg | ORAL_TABLET | Freq: Every day | ORAL | Status: DC
  Administered 2019-11-29 – 2019-11-30 (×2): 100 mg via ORAL
  Filled 2019-11-28 (×2): qty 1

## 2019-11-28 MED ORDER — IOHEXOL 300 MG/ML  SOLN
100.0000 mL | Freq: Once | INTRAMUSCULAR | Status: AC | PRN
  Administered 2019-11-28: 16:00:00 100 mL via INTRAVENOUS

## 2019-11-28 MED ORDER — LORAZEPAM 2 MG/ML IJ SOLN: 1.0000 mg | INTRAMUSCULAR | Status: DC | PRN

## 2019-11-28 NOTE — Progress Notes (Signed)
CODE SEPSIS - PHARMACY COMMUNICATION  **Broad Spectrum Antibiotics should be administered within 1 hour of Sepsis diagnosis**  Time Code Sepsis Called/Page Received: 07:19  Antibiotics Ordered: Ceftriaxone and Vancomcyin  Time of 1st antibiotic administration: Ceftriaxone given at 07:25  Additional action taken by pharmacy: n/a  If necessary, Name of Provider/Nurse Contacted: n/a    Foye Deer ,PharmD Clinical Pharmacist  11/28/2019  7:42 AM

## 2019-11-28 NOTE — H&P (Addendum)
History and Physical    Raymond Cole FTD:[id number] DOB: [birth year] DOA: 11/28/2019  PCP: Patient, No Pcp Per   Patient coming from: Home  I have personally briefly reviewed patient's old medical records in [place]  Chief Complaint: Pain and swelling in the right forearm  HPI: Raymond Cole is a 24 y.o. male with medical history significant for polysubstance abuse.  He presents to the emergency room with complaints of right arm pain and swelling over the dorsum of his right hand that started 2 days ago. Swelling has progressed and redness has begun to track up his right forearm reaching the area of his elbow and extending to his upper arm.  He admits to regular IV drug use and last injected heroin and methamphetamines earlier this morning.  Denies regular alcohol use In the ER he was tachycardic, hypotensive with a low-grade temp, T max of 100.20F.  Lactic acid was within normal limits  ED Course: 24 year old male with history of IV drug abuse who presents to the ED complaining of increased right hand and arm pain and swelling since yesterday.  He is noted to have an axillary temp of 100.20F. Given his tachycardia and tachypnea, as well as obvious cellulitis, presentation is consistent with sepsis.  He received IV Rocephin and vancomycin in the emergency room as well as his sepsis fluid bolus. He is neurovascularly intact to his right upper extremity He will be admitted to the hospital for further evaluation  Review of Systems: As per HPI otherwise 10 point review of systems negative.    Past Medical History:  Diagnosis Date  . Anxiety    NO MEDS  . Pneumonia    H/O    Past Surgical History:  Procedure Laterality Date  . MOUTH SURGERY       reports that he has been smoking cigarettes. He has a 5.00 pack-year smoking history. He has never used smokeless tobacco. He reports current alcohol use. He reports current drug use. Drugs: Marijuana, Cocaine, Benzodiazepines, and  Methamphetamines.  No Known Allergies  History reviewed. No pertinent family history.   Prior to Admission medications   Medication Sig Start Date End Date Taking? Authorizing Provider  ibuprofen (ADVIL,MOTRIN) 200 MG tablet Take 800 mg by mouth every 6 (six) hours as needed for fever, mild pain or moderate pain.    [provider]    Physical Exam: Vitals:   11/28/19 0646 11/28/19 0647 11/28/19 0706 11/28/19 0800  BP:  98/79 (!) 77/57 132/87  Pulse:  (!) 133 (!) 132 (!) 126  Resp:  18 (!) 35 (!) 28  Temp:  100.1 F (37.8 C)    TempSrc:  Oral    SpO2:  93% 97% 99%  Weight: 90.7 kg     Height: 5\' 10"  (1.778 m)        Vitals:   11/28/19 0646 11/28/19 0647 11/28/19 0706 11/28/19 0800  BP:  98/79 (!) 77/57 132/87  Pulse:  (!) 133 (!) 132 (!) 126  Resp:  18 (!) 35 (!) 28  Temp:  100.1 F (37.8 C)    TempSrc:  Oral    SpO2:  93% 97% 99%  Weight: 90.7 kg     Height: 5\' 10"  (1.778 m)       Constitutional: NAD, alert and oriented x 3. Very restless Eyes: PERRL, lids and conjunctivae normal ENMT: Mucous membranes are moist.  Neck: normal, supple, no masses, no thyromegaly Respiratory: clear to auscultation bilaterally, no wheezing, no crackles.  Normal respiratory effort. No accessory muscle use.  Cardiovascular: tachycardia, no murmurs / rubs / gallops. No extremity edema. 2+ pedal pulses. No carotid bruits.  Abdomen: no tenderness, no masses palpated. No hepatosplenomegaly. Bowel sounds positive.  Musculoskeletal: no clubbing / cyanosis. Redness and swelling of fingers over the dorsum of the right hand with tracking into the forearm and upper arm.  Able to move his fingers.  Palpable pulses Skin: no rashes, lesions, ulcers. Track marks right forearm Neurologic: No gross focal neurologic deficit. Psychiatric: Normal mood and affect.   Labs on Admission: I have personally reviewed following labs and imaging studies  CBC: Recent Labs  Lab 11/28/19 0653  WBC  23.0*  NEUTROABS 20.1*  HGB 11.9*  HCT 35.8*  MCV 94.0  PLT 263   Basic Metabolic Panel: Recent Labs  Lab 11/28/19 0653  NA 131*  K 3.6  CL 95*  CO2 25  GLUCOSE 122*  BUN 17  CREATININE 1.05  CALCIUM 8.5*   GFR: Estimated Creatinine Clearance: 124 mL/min (by C-G formula based on SCr of 1.05 mg/dL). Liver Function Tests: Recent Labs  Lab 11/28/19 0653  AST 29  ALT 27  ALKPHOS 64  BILITOT 0.6  PROT 7.0  ALBUMIN 3.5   No results for input(s): LIPASE, AMYLASE in the last 168 hours. No results for input(s): AMMONIA in the last 168 hours. Coagulation Profile: Recent Labs  Lab 11/28/19 0653  INR 1.1   Cardiac Enzymes: No results for input(s): CKTOTAL, CKMB, CKMBINDEX, TROPONINI in the last 168 hours. BNP (last 3 results) No results for input(s): PROBNP in the last 8760 hours. HbA1C: No results for input(s): HGBA1C in the last 72 hours. CBG: No results for input(s): GLUCAP in the last 168 hours. Lipid Profile: No results for input(s): CHOL, HDL, LDLCALC, TRIG, CHOLHDL, LDLDIRECT in the last 72 hours. Thyroid Function Tests: No results for input(s): TSH, T4TOTAL, FREET4, T3FREE, THYROIDAB in the last 72 hours. Anemia Panel: No results for input(s): VITAMINB12, FOLATE, FERRITIN, TIBC, IRON, RETICCTPCT in the last 72 hours. Urine analysis:    Component Value Date/Time   COLORURINE YELLOW (A) 08/28/2018 1438   APPEARANCEUR CLOUDY (A) 08/28/2018 1438   LABSPEC 1.013 08/28/2018 1438   PHURINE 7.0 08/28/2018 1438   GLUCOSEU NEGATIVE 08/28/2018 1438   HGBUR NEGATIVE 08/28/2018 1438   BILIRUBINUR NEGATIVE 08/28/2018 1438   KETONESUR NEGATIVE 08/28/2018 1438   PROTEINUR NEGATIVE 08/28/2018 1438   NITRITE NEGATIVE 08/28/2018 1438   LEUKOCYTESUR NEGATIVE 08/28/2018 1438    Radiological Exams on Admission: DG Hand 2 View Right  Result Date: 11/28/2019 CLINICAL DATA:  Infection, possible sepsis EXAM: RIGHT HAND - 2 VIEW COMPARISON:  None. FINDINGS: There is no  evidence of fracture or dislocation. There is no evidence of arthropathy or other focal bone abnormality. Diffuse soft tissue edema about the hand and wrist. IMPRESSION: No fracture or dislocation of the right hand. Diffuse soft tissue edema. Electronically Signed   By: Lauralyn Primes M.D.   On: 11/28/2019 08:12   DG Chest Port 1 View  Result Date: 11/28/2019 CLINICAL DATA:  Right hand swelling and pain.  Possible sepsis. EXAM: PORTABLE CHEST 1 VIEW COMPARISON:  10/07/2018. FINDINGS: Trachea is midline. Heart size normal. Lungs are clear. No pleural fluid. IMPRESSION: No acute findings. Electronically Signed   By: Leanna Battles M.D.   On: 11/28/2019 08:11    EKG: Independently reviewed.   Sinus tachycardia  Assessment/Plan Principal Problem:   Sepsis (HCC) Active Problems:   Drug abuse (HCC)  Cellulitis of arm, right   Sepsis from right arm cellulitis Patient with a history of IVDA who presents for evaluation of redness and swelling involving the right arm. On admission he had a low-grade temp with a T-max of 100.43F, he was hypotensive with systolic blood pressure of and tachycardic.  He has marked leukocytosis with a left shift He received his sepsis fluid bolus with improvement in his blood pressure Place patient on IV vancomycin for MRSA coverage and Zosyn for gram-negative coverage Follow-up results of blood cultures     DVT prophylaxis: Lovenox  Code Status: Full Family Communication: Plan of care has been discussed with patient in detail he verbalizes understanding and agrees with the plan Disposition Plan: Back to previous home environment Consults called: Orthopedics    Jenica Costilow MD [facility]     11/28/2019, 9:31 AM

## 2019-11-28 NOTE — ED Notes (Signed)
Notified by MRI that pt unable to sit still for imaging to be done. Pt c/o pain. MD Agbata notified. States she will add PRN pain medication. Pt appears restless.

## 2019-11-28 NOTE — ED Notes (Signed)
Pt restless, unable to sit still, pacing in room. Requesting pain medication. Appears slightly diaphoretic. States, "I can't do this shit." message sent to admitting again via secure chat. Awaiting orders. Pt aware.

## 2019-11-28 NOTE — ED Notes (Signed)
This RN called back to bedside approx 5 mins after assessment, pt c/o it's going numb but is unable to state where he feels numb. Pt maintains sensation to fingers, flinches when top of hand is palpated, then states "it's weaker in my thumb". This RN assisted patient with elevating hand further, encouraged patient to leave his hand elevated at this time.

## 2019-11-28 NOTE — ED Notes (Signed)
Pt with increased swelling than when he first arrived noted to R hand, cap refill < 3 seconds, pt continues to have limited ROM to fingers due to swelling, +2 radial pulse to R hand. Pt's skin remains red and hot to touch.   Admitting MD aware of this RN's assessment. Pt continues to have R hand elevated at this time.

## 2019-11-28 NOTE — Consult Note (Signed)
ORTHOPAEDIC CONSULTATION  REQUESTING PHYSICIAN: Collier Bullock, MD  Chief Complaint: Right hand pain and swelling  HPI: Raymond Cole is a 24 y.o. male with a history of IV drug use presents to the emergency room with right hand swelling and redness.  The patient first noted swelling yesterday morning when he woke up.  The swelling has steadily increased over the dorsum of his right hand.  A sending erythema was noted.  Patient injected heroin and methamphetamines earlier this morning.  Patient states he typically injects into his left arm and does not remember injecting into the right hand recently.  Patient's white count is 23.0.  Blood cultures are pending.  Drug screen is positive for amphetamines and opiates.  Orthopedics is consulted from swelling of the right hand and forearm.  Past Medical History:  Diagnosis Date  . Anxiety    NO MEDS  . Pneumonia    H/O   Past Surgical History:  Procedure Laterality Date  . MOUTH SURGERY     Social History   Socioeconomic History  . Marital status: Single    Spouse name: Not on file  . Number of children: Not on file  . Years of education: Not on file  . Highest education level: Not on file  Occupational History  . Not on file  Tobacco Use  . Smoking status: Current Every Day Smoker    Packs/day: 1.00    Years: 5.00    Pack years: 5.00    Types: Cigarettes  . Smokeless tobacco: Never Used  Substance and Sexual Activity  . Alcohol use: Yes    Comment: today  . Drug use: Yes    Types: Marijuana, Cocaine, Benzodiazepines, Methamphetamines    Comment: +UDS ON 03-04-17 FOR COCAINE, MARIJUANA  AND BENZO'S  . Sexual activity: Not on file  Other Topics Concern  . Not on file  Social History Narrative   ** Merged History Encounter **       Social Determinants of Health   Financial Resource Strain:   . Difficulty of Paying Living Expenses:   Food Insecurity:   . Worried About Charity fundraiser in the Last Year:   . Arts development officer in the Last Year:   Transportation Needs:   . Film/video editor (Medical):   Marland Kitchen Lack of Transportation (Non-Medical):   Physical Activity:   . Days of Exercise per Week:   . Minutes of Exercise per Session:   Stress:   . Feeling of Stress :   Social Connections:   . Frequency of Communication with Friends and Family:   . Frequency of Social Gatherings with Friends and Family:   . Attends Religious Services:   . Active Member of Clubs or Organizations:   . Attends Archivist Meetings:   Marland Kitchen Marital Status:    History reviewed. No pertinent family history. No Known Allergies Prior to Admission medications   Not on File   DG Hand 2 View Right  Result Date: 11/28/2019 CLINICAL DATA:  Infection, possible sepsis EXAM: RIGHT HAND - 2 VIEW COMPARISON:  None. FINDINGS: There is no evidence of fracture or dislocation. There is no evidence of arthropathy or other focal bone abnormality. Diffuse soft tissue edema about the hand and wrist. IMPRESSION: No fracture or dislocation of the right hand. Diffuse soft tissue edema. Electronically Signed   By: Eddie Candle M.D.   On: 11/28/2019 08:12   DG Chest Port 1 View  Result Date: 11/28/2019 CLINICAL  DATA:  Right hand swelling and pain.  Possible sepsis. EXAM: PORTABLE CHEST 1 VIEW COMPARISON:  10/07/2018. FINDINGS: Trachea is midline. Heart size normal. Lungs are clear. No pleural fluid. IMPRESSION: No acute findings. Electronically Signed   By: Leanna Battles M.D.   On: 11/28/2019 08:11    Positive ROS: All other systems have been reviewed and were otherwise negative with the exception of those mentioned in the HPI and as above.  Physical Exam: General: Alert, no acute distress, complaining of pain   MUSCULOSKELETAL: Right hand/forearm: Patient has the majority of swelling over the dorsum of the right hand with associated erythema.  He has numerous tattoos over his hand and forearm.  Patient has dorsal hand swelling but  his forearm compartments are soft and compressible.  Patient can flex and extend his digits with mild to moderate pain.  Patient fingers well-perfused.  He has intact sensation light touch in all 5 digits.  Assessment: Right hand erythema and swelling secondary to cellulitis versus abscess  Plan: An MRI of the right hand and forearm has been ordered.  The MRI will be used to determine if there is an abscess that is amenable to surgical drainage.  If it is just cellulitis, then IV antibiotics will be definitive treatment.  Continue Zosyn and vancomycin as prescribed by the hospitalist service.  Patient will be admitted for IV antibiotics.  I will follow up with the MRI results once available.    Juanell Fairly, MD    11/28/2019 12:55 PM

## 2019-11-28 NOTE — ED Triage Notes (Addendum)
Pt presents w/ c/o R hand swelling and pain, starting on Saturday, he thinks. Pt denies injury. Pt has swelling and red streaks past elbow. Pt is agitated but reasonably cooperative. Pt is under the influence of ETOH and admits to this. Pt admits to heroin and methamphetamine use within past 2 hrs.

## 2019-11-28 NOTE — ED Notes (Signed)
This RN to bedside, pt provided with meal tray at this time. Pt states swelling to R hand is getting worse since this RN last assessed his R hand. Meds administered per order. Pt noted to have R hand elevated at this time. Pt with support person at bedside at this time. Pt continuously removes BP cuff. Pt noted to still not be able to sit still in bed.

## 2019-11-28 NOTE — ED Notes (Signed)
This RN to bedside due to patient calling out. Pt c/o feeling like his hand is swelling more than when he arrived. Mildly more swelling noted then when patient arrived. Pt maintains <3 sec cap refill, limited movement, strong radial pulses to R hand. Redness, swelling, and warm to touch noted on re-assessment consistent with previous assessment.

## 2019-11-28 NOTE — ED Notes (Signed)
EDP at bedside to re-assess patient.  

## 2019-11-28 NOTE — ED Notes (Signed)
2nd LR bolus initiated by this RN, pt visualized in NAD, resting in bed with lights dimmed. Pt given a pillow to position his arm for comfort. Pt on facetime with friend speaking with friend at this time. Denies further needs. Call bell remains within reach at this time.

## 2019-11-28 NOTE — ED Notes (Signed)
Pt remains ST on the monitor, temp in room adjusted per patient request. Pt adjusted in bed. Call bell remains within reach. Pt instructed to call for further needs and not get out of bed without assistance. Pt states understanding.

## 2019-11-28 NOTE — ED Provider Notes (Signed)
[facility] Emergency Department Provider Note   ____________________________________________   First MD Initiated Contact with Patient 11/28/19 0703     (approximate)  I have reviewed the triage vital signs and the nursing notes.   HISTORY  Chief Complaint Cellulitis    HPI Raymond Cole is a 24 y.o. male with past medical history of polysubstance abuse who presents to the ED complaining of arm pain and swelling.  Patient reports that yesterday morning he awoke to notice redness and swelling over the dorsum of his right hand.  Swelling has since steadily increased and redness has begun to track up his right forearm, reaching the area of his elbow.  He has not taken his temperature at home, but has felt hot and endorses generalized weakness and malaise.  He admits to regular IV drug use, last injected heroin and methamphetamines earlier this morning.  He denies regular alcohol abuse.  He states he typically injects into his left arm and does not remember recently injecting to the area of infection in his right hand or forearm.        Past Medical History:  Diagnosis Date  . Anxiety    NO MEDS  . Pneumonia    H/O    Patient Active Problem List   Diagnosis Date Noted  . Drug abuse (HCC) 02/03/2019  . Drug overdose 10/07/2018  . 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 Alcohol intoxication (HCC) 03/04/2017    Past Surgical History:  Procedure Laterality Date  . MOUTH SURGERY      Prior to Admission medications   Medication Sig Start Date End Date Taking? Authorizing Provider  amoxicillin-clavulanate (AUGMENTIN) 875-125 MG tablet Take 1 tablet by mouth every 12 (twelve) hours. Patient not taking: Reported on 02/02/2019 10/08/18   Ramonita Lab, MD  folic acid (FOLVITE) 1 MG tablet Take 1 tablet (1 mg total) by mouth daily. Patient not taking: Reported on 02/02/2019 10/09/18   Ramonita Lab, MD  ibuprofen (ADVIL,MOTRIN) 200 MG tablet Take  800 mg by mouth every 6 (six) hours as needed for fever, mild pain or moderate pain.    [provider]  Multiple Vitamin (MULTIVITAMIN WITH MINERALS) TABS tablet Take 1 tablet by mouth daily. Patient not taking: Reported on 02/02/2019 10/09/18   Ramonita Lab, MD  nicotine (NICODERM CQ - DOSED IN MG/24 HOURS) 14 mg/24hr patch Place 1 patch (14 mg total) onto the skin daily. Patient not taking: Reported on 02/02/2019 10/09/18   Ramonita Lab, MD  thiamine 100 MG tablet Take 1 tablet (100 mg total) by mouth daily. Patient not taking: Reported on 02/02/2019 10/09/18   Ramonita Lab, MD    Allergies Patient has no known allergies.  History reviewed. No pertinent family history.  Social History Social History   Tobacco Use  . Smoking status: Current Every Day Smoker    Packs/day: 1.00    Years: 5.00    Pack years: 5.00    Types: Cigarettes  . Smokeless tobacco: Never Used  Substance Use Topics  . Alcohol use: Yes    Comment: today  . Drug use: Yes    Types: Marijuana, Cocaine, Benzodiazepines, Methamphetamines    Comment: +UDS ON 03-04-17 FOR COCAINE, MARIJUANA  AND BENZO'S    Review of Systems  Constitutional: Positive for fever/chills Eyes: No visual changes. ENT: No sore throat. Cardiovascular: Denies chest pain. Respiratory: Denies shortness of breath. Gastrointestinal: No abdominal pain.  No nausea, no vomiting.  No diarrhea.  No constipation. Genitourinary:  Negative for dysuria. Musculoskeletal: Negative for back pain.  Positive for right arm pain and swelling. Skin: Positive for rash. Neurological: Negative for headaches, focal weakness or numbness.  ____________________________________________   PHYSICAL EXAM:  VITAL SIGNS: ED Triage Vitals  Enc Vitals Group     BP 11/28/19 0647 98/79     Pulse Rate 11/28/19 0647 (!) 133     Resp 11/28/19 0647 18     Temp 11/28/19 0647 100.1 F (37.8 C)     Temp Source 11/28/19 0647 Oral     SpO2 11/28/19 0647 93 %      Weight 11/28/19 0646 200 lb (90.7 kg)     Height 11/28/19 0646 5\' 10"  (1.778 m)     Head Circumference --      Peak Flow --      Pain Score 11/28/19 0644 7     Pain Loc --      Pain Edu? --      Excl. in GC? --     Constitutional: Alert and oriented. Eyes: Conjunctivae are normal. Head: Atraumatic. Nose: No congestion/rhinnorhea. Mouth/Throat: Mucous membranes are moist. Neck: Normal ROM Cardiovascular: Tachycardic, regular rhythm. Grossly normal heart sounds.  2+ radial pulses bilaterally. Respiratory: Tachypneic with normal respiratory effort.  No retractions. Lungs CTAB. Gastrointestinal: Soft and nontender. No distention. Genitourinary: deferred Musculoskeletal: No lower extremity tenderness nor edema.  Significant erythema, edema, and warmth to dorsum of right hand with erythema and edema tracking into right forearm, reaching right elbow with additional streaks of erythema to more proximal arm. Neurologic:  Normal speech and language. No gross focal neurologic deficits are appreciated. Skin:  Skin is warm, dry and intact. Psychiatric: Mood and affect are normal. Speech and behavior are normal.  ____________________________________________   LABS (all labs ordered are listed, but only abnormal results are displayed)  Labs Reviewed  COMPREHENSIVE METABOLIC PANEL - Abnormal; Notable for the following components:      Result Value   Sodium 131 (*)    Chloride 95 (*)    Glucose, Bld 122 (*)    Calcium 8.5 (*)    All other components within normal limits  CBC WITH DIFFERENTIAL/PLATELET - Abnormal; Notable for the following components:   WBC 23.0 (*)    RBC 3.81 (*)    Hemoglobin 11.9 (*)    HCT 35.8 (*)    Neutro Abs 20.1 (*)    Monocytes Absolute 1.3 (*)    Abs Immature Granulocytes 0.25 (*)    All other components within normal limits  CULTURE, BLOOD (ROUTINE X 2)  CULTURE, BLOOD (ROUTINE X 2)  URINE CULTURE  SARS CORONAVIRUS 2 (TAT 6-24 HRS)  LACTIC ACID, PLASMA    PROTIME-INR  ETHANOL  URINALYSIS, COMPLETE (UACMP) WITH MICROSCOPIC  URINE DRUG SCREEN, QUALITATIVE ([facility] ONLY)   ____________________________________________  EKG  ED ECG REPORT I, 01/28/20, the attending physician, personally viewed and interpreted this ECG.   Date: 11/28/2019  EKG Time: 6:56  Rate: 129  Rhythm: sinus tachycardia  Axis: Normal  Intervals:none  ST&T Change: None    PROCEDURES  Procedure(s) performed (including Critical Care):  .Critical Care Performed by: 01/28/2020, MD Authorized by: Chesley Noon, MD   Critical care provider statement:    Critical care time (minutes):  45   Critical care time was exclusive of:  Separately billable procedures and treating other patients and teaching time   Critical care was necessary to treat or prevent imminent or life-threatening deterioration of the following conditions:  Sepsis  Critical care was time spent personally by me on the following activities:  Discussions with consultants, evaluation of patient's response to treatment, examination of patient, ordering and performing treatments and interventions, ordering and review of laboratory studies, ordering and review of radiographic studies, pulse oximetry, re-evaluation of patient's condition, obtaining history from patient or surrogate and review of old charts   I assumed direction of critical care for this patient from another provider in my specialty: no   .1-3 Lead EKG Interpretation Performed by: Blake Divine, MD Authorized by: Blake Divine, MD     Interpretation: abnormal     ECG rate:  133   ECG rate assessment: tachycardic     Rhythm: sinus tachycardia     Ectopy: none     Conduction: normal       ____________________________________________   INITIAL IMPRESSION / ASSESSMENT AND PLAN / ED COURSE       24 year old male with history of IV drug abuse presents to the ED complaining of increased right hand and arm pain and  swelling since yesterday.  He is noted to have an axillary temp of 100.1 and I suspect his true core temp is higher, but he has a hard time complying with oral temperature check.  Given his tachycardia and tachypnea, as well as obvious cellulitis, presentation is consistent with sepsis.  We will start broad-spectrum antibiotics and give 30 cc/kg fluid bolus.  EKG shows sinus tachycardia with no acute ischemic changes.  He is neurovascularly intact to his right upper extremity, we will treat pain with IV morphine and assess for foreign body with x-ray.  X-rays negative for foreign body or other acute process, chest x-ray also unremarkable.  Lab work significant for leukocytosis, consistent with sepsis.  Blood pressure improved and tachycardia gradually improving with IV fluid administration.  Case discussed with hospitalist for admission.      ____________________________________________   FINAL CLINICAL IMPRESSION(S) / ED DIAGNOSES  Final diagnoses:  Sepsis without acute organ dysfunction, due to unspecified organism [facility])  Right arm cellulitis  IVDU (intravenous drug user)     ED Discharge Orders    None       Note:  This document was prepared using Dragon voice recognition software and may include unintentional dictation errors.   Blake Divine, MD 11/28/19 [phone]

## 2019-11-28 NOTE — ED Notes (Signed)
Empty insulin syringe noted to fall out of pt's t-shirt when belongings were being bagged. No other contraband noted at this time. Belongings left at [facility] nurses' station per floor RN request. 1 bag and irrigation tray with 1 of pt's ear gauge that fell out in CT.

## 2019-11-28 NOTE — ED Notes (Signed)
Admitting MD made aware of new c/o numbness and increased swelling. No new orders.

## 2019-11-28 NOTE — Progress Notes (Signed)
Pharmacy Antibiotic Note  Raymond Cole is a 24 y.o. male admitted on 11/28/2019 with cellulitis.  Pharmacy has been consulted for Vancomycin and Zosyn dosing.   Plan: Patient received Vancomycin 2g IV loading dose, will follow with Vancomycin 1500 mg IV Q 12 hrs. Goal AUC 400-550. Expected AUC: 468 SCr used: 1.05 Expected Cmin: 11.8  Zosyn 3.375g IV q8h   Height: 5\' 10"  (177.8 cm) Weight: 90.7 kg (200 lb) IBW/kg (Calculated) : 73  Temp (24hrs), Avg:100.1 F (37.8 C), Min:100.1 F (37.8 C), Max:100.1 F (37.8 C)  Recent Labs  Lab 11/28/19 0653 11/28/19 0654  WBC 23.0*  --   CREATININE 1.05  --   LATICACIDVEN  --  1.1    Estimated Creatinine Clearance: 124 mL/min (by C-G formula based on SCr of 1.05 mg/dL).    No Known Allergies  Antimicrobials this admission: Ceftriaxone 4/5 x 1 Vancomycin 4/5 >>  Zosyn 4/5 >>    Dose adjustments this admission:  Microbiology results: 4/5 BCx:   4/5 UCx:    Thank you for allowing pharmacy to be a part of this patient's care.  01/28/20, PharmD, BCPS 11/28/2019 10:02 AM

## 2019-11-28 NOTE — ED Notes (Addendum)
Noted pt coming out of room into hallway with IV tubing broken and dripping blood. Pt remains restless, pacing, swinging limbs, causing drops of blood to fly onto walls, door, equipment, and all over floor in room and in hallway. Trying to use cell phone and repeatedly dropping it on the floor. Pt had also spilled coffee on floor in room. Tubing disconnected from IV site and bleeding stopped. Pt redirected back to bed, appears confused. EVS called for floors/walls. Pt requires multiple redirections back to bed. EDP Jess[place] at bedside. Charge Secondary school teacher aware. Message sent to MD Agbata.

## 2019-11-28 NOTE — Progress Notes (Signed)
At approximately 16:48, pt arrived from ED. Pt. was delirious,pacing, restless, could not stay still. Wanting to go home. Very uncooperative and non-redirectable. MD was notified and ordered 2 mg of ativan STAT with no result. Md ordered Haldol IV with positive result. Non--violent restraint was initiated by ICU charge Nurse. Patient has in person sitter as well for safety. Continue to monitor restraint per protocol.

## 2019-11-28 NOTE — ED Notes (Signed)
Pt visualized in NAD. Pt provided with urinal at this time.

## 2019-11-28 NOTE — Progress Notes (Signed)
PHARMACY -  BRIEF ANTIBIOTIC NOTE   Pharmacy has received consult(s) for Vancomycin from an ED provider.  The patient's profile has been reviewed for ht/wt/allergies/indication/available labs.    One time order(s) placed for Vancomycin 1g by ED provider, will order additional Vancomycin 1g to complete a 2g loading dose.  Further antibiotics/pharmacy consults should be ordered by admitting physician if indicated.                       Thank you, Foye Deer 11/28/2019  7:21 AM

## 2019-11-28 NOTE — ED Notes (Signed)
This RN to bedside, pt visualized standing on bed. Pt assisted down from bed at this time. Pt assisted with urinal use at this time. This RN remains at bedside while patient using urinal. Pt reports he was unable to use urinal while laying down so he stood up to call this RN, this RN reiterated to patient where call bell was. Pt states understanding at this time.

## 2019-11-28 NOTE — ED Notes (Signed)
Admitting MD made aware that patient c/o increased swelling to R hand at this time.

## 2019-11-29 DIAGNOSIS — F1113 Opioid abuse with withdrawal: Secondary | ICD-10-CM

## 2019-11-29 DIAGNOSIS — L818 Other specified disorders of pigmentation: Secondary | ICD-10-CM

## 2019-11-29 DIAGNOSIS — F1513 Other stimulant abuse with withdrawal: Secondary | ICD-10-CM

## 2019-11-29 DIAGNOSIS — F17210 Nicotine dependence, cigarettes, uncomplicated: Secondary | ICD-10-CM

## 2019-11-29 LAB — COMPREHENSIVE METABOLIC PANEL
ALT: 20 U/L (ref 0–44)
AST: 19 U/L (ref 15–41)
Albumin: 2.7 g/dL — ABNORMAL LOW (ref 3.5–5.0)
Alkaline Phosphatase: 54 U/L (ref 38–126)
Anion gap: 11 (ref 5–15)
BUN: 7 mg/dL (ref 6–20)
CO2: 25 mmol/L (ref 22–32)
Calcium: 7.9 mg/dL — ABNORMAL LOW (ref 8.9–10.3)
Chloride: 101 mmol/L (ref 98–111)
Creatinine, Ser: 0.81 mg/dL (ref 0.61–1.24)
GFR calc Af Amer: >60 mL/min (ref >60)
GFR calc non Af Amer: >60 mL/min (ref >60)
Glucose, Bld: 84 mg/dL (ref 70–99)
Potassium: 3.2 mmol/L — ABNORMAL LOW (ref 3.5–5.1)
Sodium: 137 mmol/L (ref 135–145)
Total Bilirubin: 1 mg/dL (ref 0.3–1.2)
Total Protein: 5.8 g/dL — ABNORMAL LOW (ref 6.5–8.1)

## 2019-11-29 LAB — CBC
HCT: 33.6 % — ABNORMAL LOW (ref 39.0–52.0)
Hemoglobin: 11 g/dL — ABNORMAL LOW (ref 13.0–17.0)
MCH: 31.6 pg (ref 26.0–34.0)
MCHC: 32.7 g/dL (ref 30.0–36.0)
MCV: 96.6 fL (ref 80.0–100.0)
Platelets: 251 10*3/uL (ref 150–400)
RBC: 3.48 MIL/uL — ABNORMAL LOW (ref 4.22–5.81)
RDW: 11.7 % (ref 11.5–15.5)
WBC: 18.2 10*3/uL — ABNORMAL HIGH (ref 4.0–10.5)
nRBC: 0 % (ref 0.0–0.2)

## 2019-11-29 LAB — URINE CULTURE: Culture: NO GROWTH

## 2019-11-29 MED ORDER — FOLIC ACID 5 MG/ML IJ SOLN
1.0000 mg | Freq: Every day | INTRAMUSCULAR | Status: DC
  Filled 2019-11-29 (×3): qty 0.2

## 2019-11-29 MED ORDER — SODIUM CHLORIDE 0.9 % IV SOLN
2.0000 g | Freq: Three times a day (TID) | INTRAVENOUS | Status: DC
  Administered 2019-11-29 – 2019-11-30 (×2): 2 g via INTRAVENOUS
  Filled 2019-11-29 (×4): qty 2

## 2019-11-29 MED ORDER — FOLIC ACID 1 MG PO TABS
1.0000 mg | ORAL_TABLET | Freq: Every day | ORAL | Status: DC
  Administered 2019-11-29 – 2019-11-30 (×2): 1 mg via ORAL
  Filled 2019-11-29 (×2): qty 1

## 2019-11-29 MED ORDER — CLINDAMYCIN PHOSPHATE 600 MG/50ML IV SOLN
600.0000 mg | Freq: Three times a day (TID) | INTRAVENOUS | Status: DC
  Filled 2019-11-29 (×3): qty 50

## 2019-11-29 MED ORDER — CLINDAMYCIN PHOSPHATE 900 MG/50ML IV SOLN
900.0000 mg | Freq: Three times a day (TID) | INTRAVENOUS | Status: DC
  Administered 2019-11-29 – 2019-11-30 (×2): 900 mg via INTRAVENOUS
  Filled 2019-11-29 (×4): qty 50

## 2019-11-29 MED ORDER — SODIUM CHLORIDE 0.9 % IV SOLN
3.0000 g | Freq: Four times a day (QID) | INTRAVENOUS | Status: DC
  Filled 2019-11-29 (×3): qty 8

## 2019-11-29 NOTE — Progress Notes (Signed)
PROGRESS NOTE  Raymond Cole  UVO:[id number] DOB: 04-07-1996 DOA: 11/28/2019 PCP: Patient, No Pcp Per  Brief Narrative: Raymond Cole is a 24 y.o. male with a history of substance abuse who presented to the ED 4/5 with 2 days of progressive right hand swelling and redness with pain tracking up the forearm in the setting of IVDU (heroin and methamphetamines). Vital signs indicated sepsis due to tachycardia, hypotension, fever, due to cellulitis. Vancomycin and ceftriaxone were given in addition to IV fluid bolus. Orthopedics was consulted and MRI was ordered, though patient was unable to cooperate for exam on the day of admission due to agitation. This is pending. ID is consulted for antibiotic recommendations. Agitation has fortunately improved with medications, briefly requiring restraints.  Assessment & Plan: Principal Problem:   Sepsis ([place]) Active Problems:   Drug abuse ([place])   Cellulitis of arm, right  Sepsis from right arm/hand cellulitis in IVDU: Sepsis physiology is improved.  - Appreciate ID recommendations. Continue IV antibiotics with cultures pending.  - Appreciate orthopedics recommendations. MRI pending to evaluate for abscess.  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  - Continue prn's to temper withdrawal symptoms - Cessation counseling to be provided once patient more clearly mentating  Acute toxic encephalopathy: Due to IV drugs.  - Supportive care.   DVT prophylaxis: Lovenox Code Status: Full Family Communication: None at bedside this AM Disposition Plan: Patient from home, likely to return there once work up complete for possible right hand abscess in IVDU with pending blood cultures.   Consultants:   Orthopedics  ID  Procedures:   None  Antimicrobials:  Vancomycin  Ceftriaxone   Zosyn  Subjective: Pain is controlled, patient tired this morning with limited other verbal input given  Objective:  Vitals:   11/28/19 1948 11/28/19 2343 11/29/19 0713 11/29/19 1506  BP: 127/79 (!) 141/76 131/85 117/72  Pulse: (!) 110 (!) 118 (!) 110 (!) 112  Resp: 17 18 18    Temp: 98.1 F (36.7 C) 98.3 F (36.8 C) 98.6 F (37 C)   TempSrc:   Axillary   SpO2: 93% 91% 97% 97%  Weight:      Height:        Intake/Output Summary (Last 24 hours) at 11/29/2019 1657 Last data filed at 11/29/2019 1500 Gross per 24 hour  Intake 2954.13 ml  Output 975 ml  Net 1979.13 ml   Filed Weights   11/28/19 0646  Weight: 90.7 kg    Gen: 24 y.o. male in no distress, minimally cooperative with exam Pulm: Non-labored breathing room air. Clear to auscultation bilaterally.  CV: Regular rate and rhythm. Prominent S1, S2 without murmur rub or gallop. No JVD or pitting dependent edema. GI: Abdomen soft, non-tender, non-distended, with normoactive bowel sounds. No organomegaly or masses felt. Ext: Warm, no deformities Skin: Right hand diffusely edematous tracking up forearm with tender erythema distal to elbow without fluctuance. Digit flexion/extension intact, cap refill brisk, pulse radial 2+. +track mark on dorsum of right hand. Neuro: Alert and oriented. Not cooperative with exam but moves all extremities. Psych: Judgement and insight appear impaired.   Data Reviewed: I have personally reviewed following labs and imaging studies  CBC: Recent Labs  Lab 11/28/19 0653 11/29/19 0418  WBC 23.0* 18.2*  NEUTROABS 20.1*  --   HGB 11.9* 11.0*  HCT 35.8* 33.6*  MCV 94.0 96.6  PLT 263 742   Basic Metabolic Panel: Recent Labs  Lab 11/28/19 0653 11/29/19 0418  NA 131* 137  K 3.6 3.2*  CL 95* 101  CO2 25 25  GLUCOSE 122* 84  BUN 17 7  CREATININE 1.05 0.81  CALCIUM 8.5* 7.9*   GFR: Estimated Creatinine Clearance: 160.7 mL/min (by C-G formula based on SCr of 0.81 mg/dL). Liver Function Tests: Recent Labs  Lab 11/28/19 0653 11/29/19 0418  AST 29 19  ALT 27 20  ALKPHOS 64 54  BILITOT 0.6 1.0  PROT 7.0  5.8*  ALBUMIN 3.5 2.7*   No results for input(s): LIPASE, AMYLASE in the last 168 hours. No results for input(s): AMMONIA in the last 168 hours. Coagulation Profile: Recent Labs  Lab 11/28/19 0653  INR 1.1   Cardiac Enzymes: No results for input(s): CKTOTAL, CKMB, CKMBINDEX, TROPONINI in the last 168 hours. BNP (last 3 results) No results for input(s): PROBNP in the last 8760 hours. HbA1C: No results for input(s): HGBA1C in the last 72 hours. CBG: No results for input(s): GLUCAP in the last 168 hours. Lipid Profile: No results for input(s): CHOL, HDL, LDLCALC, TRIG, CHOLHDL, LDLDIRECT in the last 72 hours. Thyroid Function Tests: No results for input(s): TSH, T4TOTAL, FREET4, T3FREE, THYROIDAB in the last 72 hours. Anemia Panel: No results for input(s): VITAMINB12, FOLATE, FERRITIN, TIBC, IRON, RETICCTPCT in the last 72 hours. Urine analysis:    Component Value Date/Time   COLORURINE STRAW (A) 11/28/2019 0748   APPEARANCEUR CLEAR (A) 11/28/2019 0748   LABSPEC 1.006 11/28/2019 0748   PHURINE 5.0 11/28/2019 0748   GLUCOSEU NEGATIVE 11/28/2019 0748   HGBUR NEGATIVE 11/28/2019 0748   BILIRUBINUR NEGATIVE 11/28/2019 0748   KETONESUR NEGATIVE 11/28/2019 0748   PROTEINUR NEGATIVE 11/28/2019 0748   NITRITE NEGATIVE 11/28/2019 0748   LEUKOCYTESUR NEGATIVE 11/28/2019 0748   Recent Results (from the past 240 hour(s))  Culture, blood (Routine x 2)     Status: None (Preliminary result)   Collection Time: 11/28/19  6:54 AM   Specimen: BLOOD  Result Value Ref Range Status   Specimen Description BLOOD LEFT FA  Final   Special Requests   Final    BOTTLES DRAWN AEROBIC AND ANAEROBIC Blood Culture results may not be optimal due to an excessive volume of blood received in culture bottles   Culture   Final    NO GROWTH 1 DAY Performed at [facility], [address] [place], [place] [address]    Report Status PENDING  Incomplete  Culture, blood (Routine x 2)     Status:  None (Preliminary result)   Collection Time: 11/28/19  7:06 AM   Specimen: BLOOD  Result Value Ref Range Status   Specimen Description BLOOD LEFT [facility]  Final   Special Requests   Final    BOTTLES DRAWN AEROBIC AND ANAEROBIC Blood Culture adequate volume   Culture   Final    NO GROWTH 1 DAY Performed at [facility], [address], [place], [place] [address]    Report Status PENDING  Incomplete  Urine culture     Status: None   Collection Time: 11/28/19  7:48 AM   Specimen: In/Out Cath Urine  Result Value Ref Range Status   Specimen Description   Final    IN/OUT CATH URINE Performed at [facility], [address], [place], [place] [address]    Special Requests   Final    NONE Performed at [facility], [address] [place], [place] [address]    Culture   Final    NO GROWTH Performed at [facility],  [address], [place], [place] [address]    Report Status 11/29/2019 FINAL  Final  SARS CORONAVIRUS 2 (TAT 6-24 HRS) Nasopharyngeal Nasopharyngeal Swab     Status: None   Collection Time: 11/28/19  7:48 AM   Specimen: Nasopharyngeal Swab  Result Value Ref Range Status   SARS Coronavirus 2 NEGATIVE NEGATIVE Final    Comment: (NOTE) SARS-CoV-2 target nucleic acids are NOT DETECTED. The SARS-CoV-2 RNA is generally detectable in upper and lower respiratory specimens during the acute phase of infection. Negative results do not preclude SARS-CoV-2 infection, do not rule out co-infections with other pathogens, and should not be used as the sole basis for treatment or other patient management decisions. Negative results must be combined with clinical observations, patient history, and epidemiological information. The expected result is Negative. Fact Sheet for Patients: HairSlick.no Fact Sheet for Healthcare Providers: quierodirigir.com This test is not yet approved or cleared by the [place] FDA and  has been authorized for detection and/or diagnosis of SARS-CoV-2 by FDA under an Emergency Use Authorization (EUA). This EUA will remain  in effect (meaning this test can be used) for the duration of the COVID-19 declaration under Section 56 4(b)(1) of the Act, 21 U.S.C. section 360bbb-3(b)(1), unless the authorization is terminated or revoked sooner. Performed at [facility], [address] [place], [place] [address]       Radiology Studies: DG Hand 2 View Right  Result Date: 11/28/2019 CLINICAL DATA:  Infection, possible sepsis EXAM: RIGHT HAND - 2 VIEW COMPARISON:  None. FINDINGS: There is no evidence of fracture or dislocation. There is no evidence of arthropathy or other focal bone abnormality. Diffuse soft tissue edema about the hand and wrist. IMPRESSION: No fracture or dislocation of the right hand. Diffuse soft tissue edema. Electronically Signed   By: Lauralyn Primes M.D.   On: 11/28/2019 08:12   DG Chest Port 1 View  Result Date: 11/28/2019 CLINICAL DATA:  Right hand swelling and pain.  Possible sepsis. EXAM: PORTABLE CHEST 1 VIEW COMPARISON:  10/07/2018. FINDINGS: Trachea is midline. Heart size normal. Lungs are clear. No pleural fluid. IMPRESSION: No acute findings. Electronically Signed   By: Leanna Battles M.D.   On: 11/28/2019 08:11    Scheduled Meds: . enoxaparin (LOVENOX) injection  40 mg Subcutaneous Q24H  . folic acid  1 mg Intravenous Daily   Or  . folic acid  1 mg Oral Daily  . multivitamin with minerals  1 tablet Oral Daily  . thiamine  100 mg Oral Daily   Or  . thiamine  100 mg Intravenous Daily   Continuous Infusions: . sodium chloride 125 mL/hr at 11/29/19 1335  . piperacillin-tazobactam (ZOSYN)  IV 3.375 g (11/29/19 1336)  . vancomycin 1,500 mg (11/29/19 0833)     LOS: 1 day   Time spent: 25 minutes.  Tyrone Nine, MD [facility] [email or web address] 11/29/2019, 4:57 PM

## 2019-11-29 NOTE — Progress Notes (Signed)
Non-  Violent Restraint was DC'd. Pt . has been asleep. In person sitter still in place.

## 2019-11-29 NOTE — Progress Notes (Addendum)
Subjective:  Patient evaluated again.  Sitter is at the bedside but soft restraints been removed.  Patient is sleeping but arousable.  He follows commands.  He denies significant pain or loss of motion in his fingers.  Objective:   VITALS:   Vitals:   11/28/19 1648 11/28/19 1948 11/28/19 2343 11/29/19 0713  BP: 125/67 127/79 (!) 141/76 131/85  Pulse: (!) 120 (!) 110 (!) 118 (!) 110  Resp:  17 18 18   Temp: (!) 101.8 F (38.8 C) 98.1 F (36.7 C) 98.3 F (36.8 C) 98.6 F (37 C)  TempSrc:    Axillary  SpO2: 96% 93% 91% 97%  Weight:      Height:        PHYSICAL EXAM: Right upper extremity: Patient has slight improvement in the dorsal hand swelling.  He has slight weeping from the dorsum of the hand near the fourth and fifth distal metacarpals.  There is no purulent drainage.  There is no open wound.  Weeping is likely from swelling.  Erythema extends up the right forearm to just below the elbow.  Patient has no pain with active flexion extension of his digits.  His fingers well-perfused.  Is intact sensation light touch in all 5 digits.  Digital range of motion is limited due to swelling of the right hand.  Patient's hand and forearm compartments are soft and compressible.  There is no clinical evidence of compartment syndrome.   LABS  Results for orders placed or performed during the hospital encounter of 11/28/19 (from the past 24 hour(s))  CBC     Status: Abnormal   Collection Time: 11/29/19  4:18 AM  Result Value Ref Range   WBC 18.2 (H) 4.0 - 10.5 K/uL   RBC 3.48 (L) 4.22 - 5.81 MIL/uL   Hemoglobin 11.0 (L) 13.0 - 17.0 g/dL   HCT 33.6 (L) 39.0 - 52.0 %   MCV 96.6 80.0 - 100.0 fL   MCH 31.6 26.0 - 34.0 pg   MCHC 32.7 30.0 - 36.0 g/dL   RDW 11.7 11.5 - 15.5 %   Platelets 251 150 - 400 K/uL   nRBC 0.0 0.0 - 0.2 %  Comprehensive metabolic panel     Status: Abnormal   Collection Time: 11/29/19  4:18 AM  Result Value Ref Range   Sodium 137 135 - 145 mmol/L   Potassium 3.2  (L) 3.5 - 5.1 mmol/L   Chloride 101 98 - 111 mmol/L   CO2 25 22 - 32 mmol/L   Glucose, Bld 84 70 - 99 mg/dL   BUN 7 6 - 20 mg/dL   Creatinine, Ser 0.81 0.61 - 1.24 mg/dL   Calcium 7.9 (L) 8.9 - 10.3 mg/dL   Total Protein 5.8 (L) 6.5 - 8.1 g/dL   Albumin 2.7 (L) 3.5 - 5.0 g/dL   AST 19 15 - 41 U/L   ALT 20 0 - 44 U/L   Alkaline Phosphatase 54 38 - 126 U/L   Total Bilirubin 1.0 0.3 - 1.2 mg/dL   GFR calc non Af Amer >60 >60 mL/min   GFR calc Af Amer >60 >60 mL/min   Anion gap 11 5 - 15    DG Hand 2 View Right  Result Date: 11/28/2019 CLINICAL DATA:  Infection, possible sepsis EXAM: RIGHT HAND - 2 VIEW COMPARISON:  None. FINDINGS: There is no evidence of fracture or dislocation. There is no evidence of arthropathy or other focal bone abnormality. Diffuse soft tissue edema about the hand and wrist.  IMPRESSION: No fracture or dislocation of the right hand. Diffuse soft tissue edema. Electronically Signed   By: Lauralyn Primes M.D.   On: 11/28/2019 08:12   DG Chest Port 1 View  Result Date: 11/28/2019 CLINICAL DATA:  Right hand swelling and pain.  Possible sepsis. EXAM: PORTABLE CHEST 1 VIEW COMPARISON:  10/07/2018. FINDINGS: Trachea is midline. Heart size normal. Lungs are clear. No pleural fluid. IMPRESSION: No acute findings. Electronically Signed   By: Leanna Battles M.D.   On: 11/28/2019 08:11    Assessment/Plan:     Principal Problem:   Sepsis (HCC) Active Problems:   Drug abuse (HCC)   Cellulitis of arm, right  Still awaiting MRI evaluation of the right hand to determine if there is an abscess.  Continue IV antibiotics for now.  Will make decision regarding surgical incision and drainage depending on whether or not the MRI can confirm a drainable abscess.  Recommend infectious disease consultation for input on antibiotic treatment.  Patient should ice and elevate the right hand on pillows.  Recommend a removable wrist splint and hand dressings as needed for weeping.      Juanell Fairly , MD 11/29/2019, 1:17 PM

## 2019-11-29 NOTE — Consult Note (Signed)
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 Pt admitted to using heroin and methamphetamine. He was agitated. His temp was 100.1 and had a HR of 133 and BP 98/79. After blood was sent for test he was started on vanco and ceftriaxone which was changed to zosyn. I am asked to see the patient for rt hand swelling/infection   Past Medical History:  Diagnosis Date  . Anxiety    NO MEDS  . Pneumonia    H/O    Past Surgical History:  Procedure Laterality Date  . MOUTH SURGERY      Social History   Socioeconomic History  . Marital status: Single    Spouse name: Not on file  . Number of children: Not on file  . Years of education: Not on file  . Highest education level: Not on file  Occupational History  . Not on file  Tobacco Use  . Smoking status: Current Every Day Smoker    Packs/day: 1.00    Years: 5.00    Pack years: 5.00    Types: Cigarettes  . Smokeless tobacco: Never Used  Substance and Sexual Activity  . Alcohol use: Yes    Comment: today  . Drug use: Yes    Types: Marijuana, Cocaine, Benzodiazepines, Methamphetamines    Comment: +UDS ON 03-04-17 FOR COCAINE, MARIJUANA  AND BENZO'S  . Sexual activity: Not on file  Other Topics Concern  . Not on file  Social History Narrative   ** Merged History Encounter **       Social Determinants of Health   Financial Resource Strain:   . Difficulty of Paying Living Expenses:   Food Insecurity:   . Worried About Programme researcher, broadcasting/film/video in the Last Year:   . Barista in the Last Year:   Transportation Needs:   . Freight forwarder (Medical):   Marland Kitchen Lack of Transportation (Non-Medical):     Physical Activity:   . Days of Exercise per Week:   . Minutes of Exercise per Session:   Stress:   . Feeling of Stress :   Social Connections:   . Frequency of Communication with Friends and Family:   . Frequency of Social Gatherings with Friends and Family:   . Attends Religious Services:   . Active Member of Clubs or Organizations:   . Attends Banker Meetings:   Marland Kitchen Marital Status:   Intimate Partner Violence:   . Fear of Current or Ex-Partner:   . Emotionally Abused:   Marland Kitchen Physically Abused:   . Sexually Abused:     History reviewed. No pertinent family history. No Known Allergies  ? Current Facility-Administered Medications  Medication Dose Route Frequency Provider Last Rate Last Admin  . 0.9 %  sodium chloride infusion   Intravenous Continuous Agbata, Tochukwu, MD 125 mL/hr at 11/29/19 1335 New Bag at 11/29/19 1335  . acetaminophen (TYLENOL) tablet 650 mg  650 mg Oral Q6H PRN Agbata, Tochukwu, MD   650 mg at 11/28/19 1538   Or  . acetaminophen (TYLENOL) suppository 650 mg  650 mg Rectal Q6H PRN Agbata, Tochukwu, MD      . enoxaparin (LOVENOX) injection 40 mg  40 mg Subcutaneous Q24H Agbata, Tochukwu, MD      . folic acid (FOLVITE) tablet 1 mg  1 mg Oral Daily Ouma, Hubbard Hartshorn, NP      . LORazepam (ATIVAN) tablet 1-4 mg  1-4 mg Oral Q1H PRN Jimmye Norman, NP       Or  . LORazepam (ATIVAN) injection 1-4 mg  1-4 mg Intravenous Q1H PRN Jimmye Norman, NP      . morphine 2 MG/ML injection 2 mg  2 mg Intravenous Q4H PRN Agbata, Tochukwu, MD   2 mg at 11/28/19 1405  . multivitamin with minerals tablet 1 tablet  1 tablet Oral Daily Ouma, Hubbard Hartshorn, NP      . ondansetron (ZOFRAN) tablet 4 mg  4 mg Oral Q6H PRN Agbata, Tochukwu, MD       Or  . ondansetron (ZOFRAN) injection 4 mg  4 mg Intravenous Q6H PRN Agbata, Tochukwu, MD      . piperacillin-tazobactam (ZOSYN) IVPB 3.375 g  3.375 g Intravenous Q8H Agbata, Tochukwu, MD 12.5 mL/hr at  11/29/19 1336 3.375 g at 11/29/19 1336  . thiamine tablet 100 mg  100 mg Oral Daily Ouma, Hubbard Hartshorn, NP       Or  . thiamine (B-1) injection 100 mg  100 mg Intravenous Daily Ouma, Hubbard Hartshorn, NP      . vancomycin (VANCOREADY) IVPB 1500 mg/300 mL  1,500 mg Intravenous Q12H Agbata, Tochukwu, MD 150 mL/hr at 11/29/19 0833 1,500 mg at 11/29/19 9678     Abtx:  Anti-infectives (From admission, onward)   Start     Dose/Rate Route Frequency Ordered Stop   11/28/19 2200  vancomycin (VANCOREADY) IVPB 1500 mg/300 mL     1,500 mg 150 mL/hr over 120 Minutes Intravenous Every 12 hours 11/28/19 0958     11/28/19 1400  piperacillin-tazobactam (ZOSYN) IVPB 3.375 g     3.375 g 12.5 mL/hr over 240 Minutes Intravenous Every 8 hours 11/28/19 0958     11/28/19 0945  piperacillin-tazobactam (ZOSYN) IVPB 3.375 g     3.375 g 100 mL/hr over 30 Minutes Intravenous  Once 11/28/19 0942 11/28/19 1111   11/28/19 0945  vancomycin (VANCOCIN) IVPB 1000 mg/200 mL premix  Status:  Discontinued     1,000 mg 200 mL/hr over 60 Minutes Intravenous  Once 11/28/19 0942 11/28/19 0947   11/28/19 0730  vancomycin (VANCOCIN) IVPB 1000 mg/200 mL premix     1,000 mg 200 mL/hr over 60 Minutes Intravenous  Once 11/28/19 0718 11/28/19 0926   11/28/19 0730  cefTRIAXone (ROCEPHIN) 2 g in sodium chloride 0.9 % 100 mL IVPB     2 g 200 mL/hr over 30 Minutes Intravenous  Once 11/28/19 0718 11/28/19 0808   11/28/19 0730  vancomycin (VANCOCIN) IVPB 1000 mg/200 mL premix     1,000 mg 200 mL/hr over 60 Minutes Intravenous  Once 11/28/19 0724 11/28/19 1037      REVIEW OF SYSTEMS:  NA , as unable to keep the patient awake Objective:  VITALS:  BP 131/85 (BP Location: Left Arm)   Pulse (!) 110   Temp 98.6 F (37 C) (Axillary)   Resp 18   Ht 5\' 10"  (1.778 m)   Wt 90.7 kg   SpO2 97%   BMI 28.70 kg/m  PHYSICAL EXAM:  General: unable to keep him awake Head: Normocephalic, without obvious abnormality, atraumatic. Eyes:  did not examine ENT cannot examine Neck: Supple,  Lungs: b/la ir entry Heart: tachycardia Abdomen:cannot examine as patient  is curled up on his rt side Extremities: rt had swollen,, erythematous and skin blistering     Skin:tattoos  Neurologic: was not assessed- moves all limbsPertinent Labs Lab Results CBC      Component Value Date/Time   WBC 18.2 (H) 11/29/2019 0418   RBC 3.48 (L) 11/29/2019 0418   HGB 11.0 (L) 11/29/2019 0418   HCT 33.6 (L) 11/29/2019 0418   PLT 251 11/29/2019 0418   MCV 96.6 11/29/2019 0418   MCH 31.6 11/29/2019 0418   MCHC 32.7 11/29/2019 0418   RDW 11.7 11/29/2019 0418   LYMPHSABS 1.3 11/28/2019 0653   MONOABS 1.3 (H) 11/28/2019 0653   EOSABS 0.1 11/28/2019 0653   BASOSABS 0.1 11/28/2019 0653    CMP Latest Ref Rng & Units 11/29/2019 11/28/2019 02/02/2019  Glucose 70 - 99 mg/dL 84 122(H) 123(H)  BUN 6 - 20 mg/dL 7 17 14   Creatinine 0.61 - 1.24 mg/dL 0.81 1.05 1.02  Sodium 135 - 145 mmol/L 137 131(L) 139  Potassium 3.5 - 5.1 mmol/L 3.2(L) 3.6 3.7  Chloride 98 - 111 mmol/L 101 95(L) 103  CO2 22 - 32 mmol/L 25 25 27   Calcium 8.9 - 10.3 mg/dL 7.9(L) 8.5(L) 9.0  Total Protein 6.5 - 8.1 g/dL 5.8(L) 7.0 6.4(L)  Total Bilirubin 0.3 - 1.2 mg/dL 1.0 0.6 0.5  Alkaline Phos 38 - 126 U/L 54 64 64  AST 15 - 41 U/L 19 29 62(H)  ALT 0 - 44 U/L 20 27 94(H)      Microbiology: Recent Results (from the past 240 hour(s))  Culture, blood (Routine x 2)     Status: None (Preliminary result)   Collection Time: 11/28/19  6:54 AM   Specimen: BLOOD  Result Value Ref Range Status   Specimen Description BLOOD LEFT FA  Final   Special Requests   Final    BOTTLES DRAWN AEROBIC AND ANAEROBIC Blood Culture results may not be optimal due to an excessive volume of blood received in culture bottles   Culture   Final    NO GROWTH 1 DAY Performed at [facility], [address], [place], [place] [address]    Report Status PENDING  Incomplete  Culture, blood  (Routine x 2)     Status: None (Preliminary result)   Collection Time: 11/28/19  7:06 AM   Specimen: BLOOD  Result Value Ref Range Status   Specimen Description BLOOD LEFT [facility]  Final   Special Requests   Final    BOTTLES DRAWN AEROBIC AND ANAEROBIC Blood Culture adequate volume   Culture   Final    NO GROWTH 1 DAY Performed at [facility], [address] [place], [place] [address]    Report Status PENDING  Incomplete  Urine culture     Status: None   Collection Time: 11/28/19  7:48 AM   Specimen: In/Out Cath Urine  Result Value Ref Range Status   Specimen Description   Final    IN/OUT CATH URINE Performed at [facility], [address] [place], [place] [address]    Special Requests   Final    NONE Performed at [facility], [address], [place], [place] [address]    Culture   Final    NO GROWTH Performed at [facility], [place] [address], [place],  [address]    Report Status 11/29/2019 FINAL  Final  SARS CORONAVIRUS 2 (TAT 6-24 HRS) Nasopharyngeal Nasopharyngeal Swab     Status: None   Collection  Time: 11/28/19  7:48 AM   Specimen: Nasopharyngeal Swab  Result Value Ref Range Status   SARS Coronavirus 2 NEGATIVE NEGATIVE Final    Comment: (NOTE) SARS-CoV-2 target nucleic acids are NOT DETECTED. The SARS-CoV-2 RNA is generally detectable in upper and lower respiratory specimens during the acute phase of infection. Negative results do not preclude SARS-CoV-2 infection, do not rule out co-infections with other pathogens, and should not be used as the sole basis for treatment or other patient management decisions. Negative results must be combined with clinical observations, patient history, and epidemiological information. The expected result is Negative. Fact Sheet for Patients: HairSlick.no Fact Sheet for Healthcare Providers: quierodirigir.com This test is not yet  approved or cleared by the [place] FDA and  has been authorized for detection and/or diagnosis of SARS-CoV-2 by FDA under an Emergency Use Authorization (EUA). This EUA will remain  in effect (meaning this test can be used) for the duration of the COVID-19 declaration under Section 56 4(b)(1) of the Act, 21 U.S.C. section 360bbb-3(b)(1), unless the authorization is terminated or revoked sooner. Performed at [facility], [address], [place], [place] [address]     IMAGING RESULTS: Xray hand- diffuse soft tissue edema I have personally reviewed the films ? Impression/Recommendation ? Rt hand cellulitis/soft tissue swelling with possible infiltration of heroin/meth- the swelling and cellulitis extends up to the elbow. Some blistering - no necrosis or circulation issues Watch for compartment syndrome which currently is not present He is on broad spectrum antibiotics- vanco and zosyn which covers pretty much all organism MRSa/pseudomonas, anerobes-  Concern for  Group A strep- hence will add clindamycin for anti-toxin effect and change zosyn to cefepime to avoid double anerobe coverage MRI is pending but unlikely he can lie still for the test- May be an bedside ultrasound can help Keep hand elevated ?pt is being followed by ortho.   Under the influence of drugs with some withdrawal effects ___________________________________________________ Discussed with care team

## 2019-11-29 NOTE — Progress Notes (Addendum)
Subjective:  Patient asleep.  Given Haldol overnight for agitation.  Soft restraint around wrists.  Patient not responding to gentle stimulus of his hand and wrist.  White blood cell count has improved today.  Patient afebrile.  Vital signs are stable aside from tachycardic heart rate at 110 currently.  Objective:   VITALS:   Vitals:   11/28/19 1648 11/28/19 1948 11/28/19 2343 11/29/19 0713  BP: 125/67 127/79 (!) 141/76 131/85  Pulse: (!) 120 (!) 110 (!) 118 (!) 110  Resp:  17 18 18   Temp: (!) 101.8 F (38.8 C) 98.1 F (36.7 C) 98.3 F (36.8 C) 98.6 F (37 C)  TempSrc:    Axillary  SpO2: 96% 93% 91% 97%  Weight:      Height:        PHYSICAL EXAM: Right upper extremity: Dorsum of the right hand still with erythema and swelling.  No pain with passive motion to the fingers.  Fingers remain well-perfused.  There is streaking erythema up the volar forearm but not short of the right elbow.  Forearm compartments are soft and compressible.   LABS  Results for orders placed or performed during the hospital encounter of 11/28/19 (from the past 24 hour(s))  HIV Antibody (routine testing w rflx)     Status: None   Collection Time: 11/28/19 10:35 AM  Result Value Ref Range   HIV Screen 4th Generation wRfx NON REACTIVE NON REACTIVE  CBC     Status: Abnormal   Collection Time: 11/29/19  4:18 AM  Result Value Ref Range   WBC 18.2 (H) 4.0 - 10.5 K/uL   RBC 3.48 (L) 4.22 - 5.81 MIL/uL   Hemoglobin 11.0 (L) 13.0 - 17.0 g/dL   HCT 01/29/20 (L) 16.1 - 09.6 %   MCV 96.6 80.0 - 100.0 fL   MCH 31.6 26.0 - 34.0 pg   MCHC 32.7 30.0 - 36.0 g/dL   RDW 04.5 40.9 - 81.1 %   Platelets 251 150 - 400 K/uL   nRBC 0.0 0.0 - 0.2 %  Comprehensive metabolic panel     Status: Abnormal   Collection Time: 11/29/19  4:18 AM  Result Value Ref Range   Sodium 137 135 - 145 mmol/L   Potassium 3.2 (L) 3.5 - 5.1 mmol/L   Chloride 101 98 - 111 mmol/L   CO2 25 22 - 32 mmol/L   Glucose, Bld 84 70 - 99 mg/dL   BUN  7 6 - 20 mg/dL   Creatinine, Ser 01/29/20 0.61 - 1.24 mg/dL   Calcium 7.9 (L) 8.9 - 10.3 mg/dL   Total Protein 5.8 (L) 6.5 - 8.1 g/dL   Albumin 2.7 (L) 3.5 - 5.0 g/dL   AST 19 15 - 41 U/L   ALT 20 0 - 44 U/L   Alkaline Phosphatase 54 38 - 126 U/L   Total Bilirubin 1.0 0.3 - 1.2 mg/dL   GFR calc non Af Amer >60 >60 mL/min   GFR calc Af Amer >60 >60 mL/min   Anion gap 11 5 - 15    DG Hand 2 View Right  Result Date: 11/28/2019 CLINICAL DATA:  Infection, possible sepsis EXAM: RIGHT HAND - 2 VIEW COMPARISON:  None. FINDINGS: There is no evidence of fracture or dislocation. There is no evidence of arthropathy or other focal bone abnormality. Diffuse soft tissue edema about the hand and wrist. IMPRESSION: No fracture or dislocation of the right hand. Diffuse soft tissue edema. Electronically Signed   By: 01/28/2020  M.D.   On: 11/28/2019 08:12   DG Chest Port 1 View  Result Date: 11/28/2019 CLINICAL DATA:  Right hand swelling and pain.  Possible sepsis. EXAM: PORTABLE CHEST 1 VIEW COMPARISON:  10/07/2018. FINDINGS: Trachea is midline. Heart size normal. Lungs are clear. No pleural fluid. IMPRESSION: No acute findings. Electronically Signed   By: Lorin Picket M.D.   On: 11/28/2019 08:11    Assessment/Plan:     Principal Problem:   Sepsis ([place]) Active Problems:   Drug abuse ([place])   Cellulitis of arm, right  Patient has been unable to tolerate an MRI due to agitation likely from withdrawal symptoms from heroin.  It is unclear whether there is an abscess of the dorsal hand.  No skin breakdown or puncture site is observed over the right hand.  Continue IV antibiotics per the medicine service.   Will obtain MRI as soon as patient is able to tolerate it.  No plan for surgery until an abscess can be confirmed.    Thornton Park , MD 11/29/2019, 8:01 AM

## 2019-11-30 ENCOUNTER — Inpatient Hospital Stay: Payer: BC Managed Care – PPO

## 2019-11-30 LAB — CREATININE, SERUM
Creatinine, Ser: 0.64 mg/dL (ref 0.61–1.24)
GFR calc Af Amer: >60 mL/min (ref >60)
GFR calc non Af Amer: >60 mL/min (ref >60)

## 2019-11-30 MED ORDER — SULFAMETHOXAZOLE-TRIMETHOPRIM 800-160 MG PO TABS: 1.0000 | ORAL_TABLET | Freq: Two times a day (BID) | ORAL | 0 refills | Status: DC

## 2019-11-30 MED ORDER — OXYCODONE HCL 5 MG PO TABS: 5.0000 mg | ORAL_TABLET | ORAL | Status: DC | PRN

## 2019-11-30 MED ORDER — AMOXICILLIN-POT CLAVULANATE 875-125 MG PO TABS: 1.0000 | ORAL_TABLET | Freq: Two times a day (BID) | ORAL | 0 refills | Status: AC

## 2019-11-30 NOTE — Progress Notes (Signed)
Pt is requesting to leave AMA. MD come to talk to pt this morning and pt had agree to stay, but now again he is wanting to leave AMA. Pt is alert and oriented X4. He understands the risk of leaving AMA and he still wants to leave. Pt has sign the AMA form and MD has been notified.

## 2019-11-30 NOTE — Progress Notes (Signed)
Subjective:  Patient has left AMA.  I have reviewed the MRIs of the right hand and forearm.  Patient has an ill-defined area of T2 hyperintensity over the dorsum of the hand possibly indicating a superficial abscess.  Tenosynovitis of the extensor tendons at the wrist.  Patient left before I could discuss these findings with him.      Objective:   VITALS:   Vitals:   11/29/19 1506 11/29/19 2312 11/30/19 0350 11/30/19 0427  BP: 117/72  126/74   Pulse: (!) 112 99 (!) 101 93  Resp:   20   Temp:   99 F (37.2 C)   TempSrc:      SpO2: 97%  99%   Weight:      Height:        PHYSICAL EXAM: Not possible.  Patient left AMA.  LABS  Results for orders placed or performed during the hospital encounter of 11/28/19 (from the past 24 hour(s))  Creatinine, serum     Status: None   Collection Time: 11/30/19  6:05 AM  Result Value Ref Range   Creatinine, Ser 0.64 0.61 - 1.24 mg/dL   GFR calc non Af Amer >60 >60 mL/min   GFR calc Af Amer >60 >60 mL/min    MR HAND RIGHT WO CONTRAST  Result Date: 11/30/2019 CLINICAL DATA:  Dorsal hand pain, swelling and erythema secondary to cellulitis versus abscess. History of IV drug abuse. EXAM: MRI OF THE RIGHT HAND WITHOUT CONTRAST TECHNIQUE: Multiplanar, multisequence MR imaging of the right hand was performed. No intravenous contrast was administered. Upon initial review of the noncontrast study, post-contrast imaging was requested by Dr. Pecolia Ades. Before that could be performed, the patient reportedly left the hospital AMA. COMPARISON:  right hand radiographs 11/28/2019. FINDINGS: Despite efforts by the technologist and patient, mild motion artifact is present on today's exam and could not be eliminated. This reduces exam sensitivity and specificity. Motion is greatest on the sagittal images. Bones/Joint/Cartilage Patient was unable to fully extend his fingers. There is no evidence of acute fracture, dislocation or bone destruction. No marrow edema or  significant joint effusions are seen. Ligaments The collateral ligaments of the metacarpal phalangeal joints appear normal. Muscles and Tendons There is mild to moderate extensor tenosynovitis at the level of the wrist. Sheath fluid is greatest within the extensor digitorum tendons, especially of the ring finger. No significant tenosynovitis within the fingers identified. Soft tissues As on earlier radiographs, there is diffuse soft tissue swelling, especially in the dorsum of the hand where there is ill-defined T2 hyperintensity superficial to the extensor tendons measuring up to 5.0 x 1.2 cm on axial image 21/14 and extending 6.8 cm on coronal image 25/10. This is suspicious for an ill-defined fluid collection and extends into the dorsal aspects of the 2nd and 3rd digits. This collection is incompletely characterized without contrast. No evidence of foreign body or soft tissue emphysema. IMPRESSION: 1. Limited examination due to motion artifact. Post-contrast imaging was requested, but was not performed prior to the patient leaving the hospital AMA. 2. Diffuse soft tissue swelling in the dorsum of the hand with ill-defined T2 hyperintensity superficial to the extensor tendons, suspicious for an ill-defined fluid collection. This is incompletely characterized without contrast and could reflect a superficial abscess. 3. Mild to moderate extensor tenosynovitis at the level of the wrist, potentially infectious. 4. No evidence of osteomyelitis or septic joint. 5. These results will be called to the ordering clinician or representative by the Radiologist Assistant, and communication  documented in the PACS or [employer]. Electronically Signed   By: Richardean Sale M.D.   On: 12-10-2019 11:37   MR FOREARM RIGHT WO CONTRAST  Result Date: 12/10/19 CLINICAL DATA:  Dorsal forearm and hand pain, swelling and erythema secondary to cellulitis versus abscess. History of IV drug abuse. EXAM: MRI OF THE RIGHT FOREARM  WITHOUT CONTRAST TECHNIQUE: Multiplanar, multisequence MR imaging of the right forearm was performed. No intravenous contrast was administered. Upon initial review of the noncontrast study, post-contrast imaging was requested by Dr. Candise Che. Before that could be performed, the patient reportedly left the hospital AMA. COMPARISON:  None. FINDINGS: Study of the hand is dictated separately. Bones/Joint/Cartilage The right radius and ulna appear normal. There is no evidence of acute fracture, dislocation or bone destruction. There is no marrow edema. No significant effusions at the right elbow or wrist. Ligaments Not relevant for exam/indication. Muscles and Tendons No significant muscular edema or atrophy. No intramuscular fluid collections are identified. As seen on the concurrent examination of the right hand, there is extensor tenosynovitis at the wrist which could be infectious. Soft tissues Extensive edema throughout the subcutaneous tissues of the right forearm, especially posteriorly and medially. This extends into the dorsum of the hand. No focal fluid collection, foreign body or soft tissue emphysema identified on noncontrast imaging. IMPRESSION: 1. Extensive edema throughout the subcutaneous tissues of the right forearm, especially posteriorly and medially, consistent with cellulitis. No focal fluid collection, foreign body or soft tissue emphysema identified. 2. No evidence of osteomyelitis. 3. Extensor tenosynovitis at the wrist, potentially infectious. Please refer to separate examination of the right hand. 4. These results will be called to the ordering clinician or representative by the Radiologist Assistant, and communication documented in the PACS or [employer]. Electronically Signed   By: Richardean Sale M.D.   On: 2019-12-10 11:43    Assessment/Plan:     Principal Problem:   Sepsis ([place]) Active Problems:   Drug abuse ([place])   Cellulitis of arm, right   Patient was discharged with  prescriptions for Augmentin and Bactrim.  The hospitalist, Dr. Bonner Puna, spoke with the patient this AM before he left AMA and explained to him the risks including death.   Thornton Park , MD 10-Dec-2019, 12:59 PM

## 2019-11-30 NOTE — Progress Notes (Signed)
We were able to complete the non-contrast portion of the MRIs of hand and forearm, but patient could not tolerate exam any further at this time to complete (end of this exam had a lot of motion and patient began rolling back and forth on table). Spoke with RN for pt, she informed me she will speak with MD and determine if these studies should be read without contrast or if patient needs to be sent back for contrast

## 2019-11-30 NOTE — Discharge Summary (Signed)
Physician Discharge Summary  Raymond Cole CHE:[id number] DOB: September 25, 1995 DOA: 11/28/2019  PCP: Patient, No Pcp Per  Admit date: 11/28/2019 Discharge date: 11/30/2019  Admitted From: Home Disposition: [place]   Recommendations for Outpatient Follow-up:  1. Follow up with PCP within the next 2 weeks. Given prescriptions for augmentin and bactrim per ID recommendations prior to patient leaving AMA.   Discharge Condition: Not stabilized CODE STATUS: Full Diet recommendation: As tolerated  Brief/Interim Summary: Raymond Cole is a 24 y.o. male with a history of substance abuse who presented to the ED 4/5 with 2 days of progressive right hand swelling and redness with pain tracking up the forearm in the setting of IVDU (heroin and methamphetamines). Vital signs indicated sepsis due to tachycardia, hypotension, fever, due to cellulitis. Vancomycin and ceftriaxone were given in addition to IV fluid bolus. Orthopedics was consulted and MRI was ordered, though patient was unable to cooperate for exam on the day of admission due to agitation. This was pending. ID was consulted for antibiotic recommendations. Agitation has fortunately improved with medications, briefly requiring restraints. On the following morning the patient stated he was leaving AMA. He briefly agreed to remain inpatient after I discussed some accommodations to relieve withdrawal and pain though before asking for any of these medications he stated he was leaving. The risk of death, etc. was discussed at length with the patient on the morning that he left.   Discharge Diagnoses:  Principal Problem:   Sepsis ([place]) Active Problems:   Drug abuse ([place])   Cellulitis of arm, right  Per orthopedics, Dr. Mack Guise: Patient has left AMA.  I have reviewed the MRIs of the right hand and forearm.  Patient has an ill-defined area of T2 hyperintensity over the dorsum of the hand possibly indicating a superficial abscess.   Tenosynovitis of the extensor tendons at the wrist.  Patient left before I could discuss these findings with him.  Despite discussions of risks including death of discharge and our plan to start narcotics as an inpatient to alleviate pain, and other accommodations, he has decided to leave. My assessment indicates he has the capacity to make medical decisions. He agrees to take the oral antibiotics (prescriptions printed and physically given to the patient prior to him leaving).   Discharge Instructions  Allergies as of 11/30/2019   No Known Allergies     Medication List    TAKE these medications   amoxicillin-clavulanate 875-125 MG tablet Commonly known as: Augmentin Take 1 tablet by mouth 2 (two) times daily for 14 days.   sulfamethoxazole-trimethoprim 800-160 MG tablet Commonly known as: BACTRIM DS Take 1 tablet by mouth 2 (two) times daily.       No Known Allergies  Consultations:  ID  Ortho  Procedures/Studies: DG Hand 2 View Right  Result Date: 11/28/2019 CLINICAL DATA:  Infection, possible sepsis EXAM: RIGHT HAND - 2 VIEW COMPARISON:  None. FINDINGS: There is no evidence of fracture or dislocation. There is no evidence of arthropathy or other focal bone abnormality. Diffuse soft tissue edema about the hand and wrist. IMPRESSION: No fracture or dislocation of the right hand. Diffuse soft tissue edema. Electronically Signed   By: Eddie Candle M.D.   On: 11/28/2019 08:12   DG Chest Port 1 View  Result Date: 11/28/2019 CLINICAL DATA:  Right hand swelling and pain.  Possible sepsis. EXAM: PORTABLE CHEST 1 VIEW COMPARISON:  10/07/2018. FINDINGS: Trachea is midline. Heart size normal. Lungs are clear. No pleural fluid. IMPRESSION:  No acute findings. Electronically Signed   By: Leanna Battles M.D.   On: 11/28/2019 08:11     Subjective: Doesn't want to stay in the hospital.   Discharge Exam: Vitals:   11/30/19 0350 11/30/19 0427  BP: 126/74   Pulse: (!) 101 93  Resp: 20     Temp: 99 F (37.2 C)   SpO2: 99%    General: Pt is alert, awake, not in acute distress Extremities:    Neuro/psych: Alert, oriented, insight is adequate, judgment is poor. Comprehension as measured by teachback is sufficient to make medical decisions. Sensorium is not clouded.  Labs: BNP (last 3 results) No results for input(s): BNP in the last 8760 hours. Basic Metabolic Panel: Recent Labs  Lab 11/28/19 0653 11/29/19 0418 11/30/19 0605  NA 131* 137  --   K 3.6 3.2*  --   CL 95* 101  --   CO2 25 25  --   GLUCOSE 122* 84  --   BUN 17 7  --   CREATININE 1.05 0.81 0.64  CALCIUM 8.5* 7.9*  --    Liver Function Tests: Recent Labs  Lab 11/28/19 0653 11/29/19 0418  AST 29 19  ALT 27 20  ALKPHOS 64 54  BILITOT 0.6 1.0  PROT 7.0 5.8*  ALBUMIN 3.5 2.7*   No results for input(s): LIPASE, AMYLASE in the last 168 hours. No results for input(s): AMMONIA in the last 168 hours. CBC: Recent Labs  Lab 11/28/19 0653 11/29/19 0418  WBC 23.0* 18.2*  NEUTROABS 20.1*  --   HGB 11.9* 11.0*  HCT 35.8* 33.6*  MCV 94.0 96.6  PLT 263 251   Cardiac Enzymes: No results for input(s): CKTOTAL, CKMB, CKMBINDEX, TROPONINI in the last 168 hours. BNP: Invalid input(s): POCBNP CBG: No results for input(s): GLUCAP in the last 168 hours. D-Dimer No results for input(s): DDIMER in the last 72 hours. Hgb A1c No results for input(s): HGBA1C in the last 72 hours. Lipid Profile No results for input(s): CHOL, HDL, LDLCALC, TRIG, CHOLHDL, LDLDIRECT in the last 72 hours. Thyroid function studies No results for input(s): TSH, T4TOTAL, T3FREE, THYROIDAB in the last 72 hours.  Invalid input(s): FREET3 Anemia work up No results for input(s): VITAMINB12, FOLATE, FERRITIN, TIBC, IRON, RETICCTPCT in the last 72 hours. Urinalysis    Component Value Date/Time   COLORURINE STRAW (A) 11/28/2019 0748   APPEARANCEUR CLEAR (A) 11/28/2019 0748   LABSPEC 1.006 11/28/2019 0748   PHURINE 5.0  11/28/2019 0748   GLUCOSEU NEGATIVE 11/28/2019 0748   HGBUR NEGATIVE 11/28/2019 0748   BILIRUBINUR NEGATIVE 11/28/2019 0748   KETONESUR NEGATIVE 11/28/2019 0748   PROTEINUR NEGATIVE 11/28/2019 0748   NITRITE NEGATIVE 11/28/2019 0748   LEUKOCYTESUR NEGATIVE 11/28/2019 0748    Microbiology Recent Results (from the past 240 hour(s))  Culture, blood (Routine x 2)     Status: None (Preliminary result)   Collection Time: 11/28/19  6:54 AM   Specimen: BLOOD  Result Value Ref Range Status   Specimen Description BLOOD LEFT FA  Final   Special Requests   Final    BOTTLES DRAWN AEROBIC AND ANAEROBIC Blood Culture results may not be optimal due to an excessive volume of blood received in culture bottles   Culture   Final    NO GROWTH 2 DAYS Performed at [facility], [address] [place], [place] [address]    Report Status PENDING  Incomplete  Culture, blood (Routine x 2)     Status: None (Preliminary  result)   Collection Time: 11/28/19  7:06 AM   Specimen: BLOOD  Result Value Ref Range Status   Specimen Description BLOOD LEFT [facility]  Final   Special Requests   Final    BOTTLES DRAWN AEROBIC AND ANAEROBIC Blood Culture adequate volume   Culture   Final    NO GROWTH 2 DAYS Performed at [facility], [address], [place], [place] [address]    Report Status PENDING  Incomplete  Urine culture     Status: None   Collection Time: 11/28/19  7:48 AM   Specimen: In/Out Cath Urine  Result Value Ref Range Status   Specimen Description   Final    IN/OUT CATH URINE Performed at [facility], [address], [place], [place] [address]    Special Requests   Final    NONE Performed at [facility], [address], [place], [place] [address]    Culture   Final    NO GROWTH Performed at [facility], [address] [place], [place] [address]    Report Status 11/29/2019 FINAL  Final  SARS CORONAVIRUS 2 (TAT 6-24 HRS) Nasopharyngeal Nasopharyngeal  Swab     Status: None   Collection Time: 11/28/19  7:48 AM   Specimen: Nasopharyngeal Swab  Result Value Ref Range Status   SARS Coronavirus 2 NEGATIVE NEGATIVE Final    Comment: (NOTE) SARS-CoV-2 target nucleic acids are NOT DETECTED. The SARS-CoV-2 RNA is generally detectable in upper and lower respiratory specimens during the acute phase of infection. Negative results do not preclude SARS-CoV-2 infection, do not rule out co-infections with other pathogens, and should not be used as the sole basis for treatment or other patient management decisions. Negative results must be combined with clinical observations, patient history, and epidemiological information. The expected result is Negative. Fact Sheet for Patients: HairSlick.no Fact Sheet for Healthcare Providers: quierodirigir.com This test is not yet approved or cleared by the [place] FDA and  has been authorized for detection and/or diagnosis of SARS-CoV-2 by FDA under an Emergency Use Authorization (EUA). This EUA will remain  in effect (meaning this test can be used) for the duration of the COVID-19 declaration under Section 56 4(b)(1) of the Act, 21 U.S.C. section 360bbb-3(b)(1), unless the authorization is terminated or revoked sooner. Performed at [facility], [address] [place], [place] [address]     Time coordinating discharge: Approximately 40 minutes  Tyrone Nine, MD  [facility] 11/30/2019, 10:48 AM

## 2019-12-03 LAB — CULTURE, BLOOD (ROUTINE X 2)
Culture: NO GROWTH
Culture: NO GROWTH
Special Requests: ADEQUATE

## 2019-12-24 ENCOUNTER — Emergency Department: Payer: BC Managed Care – PPO

## 2019-12-24 ENCOUNTER — Other Ambulatory Visit: Payer: Self-pay

## 2019-12-24 ENCOUNTER — Emergency Department
Admission: EM | Admit: 2019-12-24 | Discharge: 2019-12-24 | Disposition: A | Discharge status: Home / Self Care | Payer: BC Managed Care – PPO | Attending: Emergency Medicine | Admitting: Emergency Medicine

## 2019-12-24 DIAGNOSIS — L02511 Cutaneous abscess of right hand: Secondary | ICD-10-CM | POA: Diagnosis not present

## 2019-12-24 DIAGNOSIS — M79641 Pain in right hand: Secondary | ICD-10-CM | POA: Diagnosis present

## 2019-12-24 DIAGNOSIS — Z79899 Other long term (current) drug therapy: Secondary | ICD-10-CM | POA: Insufficient documentation

## 2019-12-24 DIAGNOSIS — F17210 Nicotine dependence, cigarettes, uncomplicated: Secondary | ICD-10-CM | POA: Insufficient documentation

## 2019-12-24 DIAGNOSIS — L03113 Cellulitis of right upper limb: Secondary | ICD-10-CM | POA: Diagnosis not present

## 2019-12-24 LAB — CBC WITH DIFFERENTIAL/PLATELET
Abs Immature Granulocytes: 0.01 10*3/uL (ref 0.00–0.07)
Basophils Absolute: 0 10*3/uL (ref 0.0–0.1)
Basophils Relative: 1 %
Eosinophils Absolute: 0.2 10*3/uL (ref 0.0–0.5)
Eosinophils Relative: 3 %
HCT: 37.1 % — ABNORMAL LOW (ref 39.0–52.0)
Hemoglobin: 12.1 g/dL — ABNORMAL LOW (ref 13.0–17.0)
Immature Granulocytes: 0 %
Lymphocytes Relative: 32 %
Lymphs Abs: 2.1 10*3/uL (ref 0.7–4.0)
MCH: 31.5 pg (ref 26.0–34.0)
MCHC: 32.6 g/dL (ref 30.0–36.0)
MCV: 96.6 fL (ref 80.0–100.0)
Monocytes Absolute: 0.5 10*3/uL (ref 0.1–1.0)
Monocytes Relative: 7 %
Neutro Abs: 3.7 10*3/uL (ref 1.7–7.7)
Neutrophils Relative %: 57 %
Platelets: 238 10*3/uL (ref 150–400)
RBC: 3.84 MIL/uL — ABNORMAL LOW (ref 4.22–5.81)
RDW: 12.6 % (ref 11.5–15.5)
WBC: 6.5 10*3/uL (ref 4.0–10.5)
nRBC: 0 % (ref 0.0–0.2)

## 2019-12-24 LAB — COMPREHENSIVE METABOLIC PANEL
ALT: 22 U/L (ref 0–44)
AST: 22 U/L (ref 15–41)
Albumin: 3.7 g/dL (ref 3.5–5.0)
Alkaline Phosphatase: 62 U/L (ref 38–126)
Anion gap: 7 (ref 5–15)
BUN: 10 mg/dL (ref 6–20)
CO2: 26 mmol/L (ref 22–32)
Calcium: 8.9 mg/dL (ref 8.9–10.3)
Chloride: 103 mmol/L (ref 98–111)
Creatinine, Ser: 0.78 mg/dL (ref 0.61–1.24)
GFR calc Af Amer: >60 mL/min (ref >60)
GFR calc non Af Amer: >60 mL/min (ref >60)
Glucose, Bld: 131 mg/dL — ABNORMAL HIGH (ref 70–99)
Potassium: 4 mmol/L (ref 3.5–5.1)
Sodium: 136 mmol/L (ref 135–145)
Total Bilirubin: 0.6 mg/dL (ref 0.3–1.2)
Total Protein: 7 g/dL (ref 6.5–8.1)

## 2019-12-24 LAB — LACTIC ACID, PLASMA: Lactic Acid, Venous: 1.1 mmol/L (ref 0.5–1.9)

## 2019-12-24 MED ORDER — CLINDAMYCIN PHOSPHATE 600 MG/50ML IV SOLN
600.0000 mg | Freq: Once | INTRAVENOUS | Status: AC
  Administered 2019-12-24: 19:00:00 600 mg via INTRAVENOUS
  Filled 2019-12-24: qty 50

## 2019-12-24 MED ORDER — CLINDAMYCIN HCL 300 MG PO CAPS: 300.0000 mg | ORAL_CAPSULE | Freq: Three times a day (TID) | ORAL | 0 refills | Status: AC

## 2019-12-24 NOTE — Discharge Instructions (Addendum)
You were seen today for abscess and cellulitis of your right hand.  This has improved per imaging.  Your lab work shows no signs of sepsis.  We gave you clindamycin intravenously and are sending you home with a prescription for clindamycin 3 times a day for the next 10 days.  Please call hand surgery and schedule an appointment for further evaluation.

## 2019-12-24 NOTE — ED Triage Notes (Addendum)
Pt states that he was admitted for an infection in the right hand, states that he got iv antibiotics and then decided to leave [facility], pt states that he was given oral antibiotics to take at home that he states he took all over, pt states that he feels like he has ignored it long enough and wants to get it seen again to see if another round of antibiotics would be enough Pt falling aleep when not being stimulated or spoken to Pt admits to leaving [facility] from here and [facility]

## 2019-12-24 NOTE — ED Notes (Signed)
See triage note, right hand is red and swollen, no drainage noted. Pt states "it used to be the size of a football." Pt reports tenderness.

## 2019-12-24 NOTE — ED Provider Notes (Signed)
[facility] Emergency Department Provider Note ____________________________________________  Time seen: 1729  I have reviewed the triage vital signs and the nursing notes.  HISTORY  Chief Complaint  Hand Pain   HPI Raymond Cole is a 24 y.o. male patient presents to the clinic today with complaint of right hand pain and swelling.  He reports this started 4/3.  He was admitted at [facility] from 4/5 to 4/7 before leaving AMA.  He was initially treated with IV vancomycin and ceftriaxone.  Orthopedics was consulted, MRI showed:  IMPRESSION: 1. Limited examination due to motion artifact. Post-contrast imaging was requested, but was not performed prior to the patient leaving the hospital AMA. 2. Diffuse soft tissue swelling in the dorsum of the hand with ill-defined T2 hyperintensity superficial to the extensor tendons, suspicious for an ill-defined fluid collection. This is incompletely characterized without contrast and could reflect a superficial abscess. 3. Mild to moderate extensor tenosynovitis at the level of the wrist, potentially infectious. 4. No evidence of osteomyelitis or septic joint.  ID was consulted recommended Augmentin and Septra.  He reports he took these medications as prescribed.  He then went to [facility] 4/8 for further evaluation.  He reports he was admitted there until 4/10 but left AMA again.   Repeat imaging showed:  IMPRESSION: 1. 12 x 7 x 2 cm abscess in the dorsum of the hand extending along the dorsal aspect of the extensor tendons over the proximal phalanges of the second through fifth digits. 2. Cellulitis. 3. Fluid in the extensor tendon sheaths of the second, third and fourth dorsal compartments consistent with infectious tenosynovitis.  He was treated with IV vancomycine and cefepime. There was discussion about transferring him to [facility] or [facility] for surgical intervention or drainage but pt left AMA.  He reports he was  discharged on some oral antibiotics (no evidence that oral abx were given in Care Everywhere) which improved his pain, redness and swelling but reports the symptoms returned as soon as he stopped taking the oral antibiotics.  He describes the pain as sore and achy but can be sharp with certain movements.  He reports decreased strength in the right hand.  He denies numbness, tingling but does have some weakness.  He insists that he does not shoot up in his right hand.  He denies fever, chills, nausea or vomiting.  He has tried ibuprofen OTC with minimal relief of symptoms.  Past Medical History:  Diagnosis Date  . Anxiety    NO MEDS  . Pneumonia    H/O    Patient Active Problem List   Diagnosis Date Noted  . Sepsis (H[facility]) 11/28/2019  . Cellulitis of arm, right 11/28/2019  . Drug abuse ([facility]) 02/03/2019  . Drug overdose 10/07/2018  . 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 Alcohol intoxication ([facility]) 03/04/2017    Past Surgical History:  Procedure Laterality Date  . MOUTH SURGERY      Prior to Admission medications   Medication Sig Start Date End Date Taking? Authorizing Provider  clindamycin (CLEOCIN) 300 MG capsule Take 1 capsule (300 mg total) by mouth 3 (three) times daily for 10 days. 12/24/19 01/03/20  Lorre Munroe, NP  sulfamethoxazole-trimethoprim (BACTRIM DS) 800-160 MG tablet Take 1 tablet by mouth 2 (two) times daily. 11/30/19   Tyrone Nine, MD    Allergies Patient has no known allergies.  No family history on file.  Social History Social History   Tobacco Use  . Smoking status:  Current Every Day Smoker    Packs/day: 1.00    Years: 5.00    Pack years: 5.00    Types: Cigarettes  . Smokeless tobacco: Never Used  Substance Use Topics  . Alcohol use: Yes    Comment: today  . Drug use: Yes    Types: Marijuana, Cocaine, Benzodiazepines, Methamphetamines    Comment: +UDS ON 03-04-17 FOR COCAINE, MARIJUANA  AND BENZO'S    Review of  Systems  Constitutional: Negative for fever, chills or body aches. Cardiovascular: Negative for chest pain or chest tightness. Respiratory: Negative for cough or shortness of breath. Musculoskeletal: Positive for right hand pain and swelling. Skin: Positive for right hand redness. Neurological: Positive for focal weakness of right hand.  Negative for headaches, tingling or numbness. ____________________________________________  PHYSICAL EXAM:  VITAL SIGNS: ED Triage Vitals  Enc Vitals Group     BP 12/24/19 1653 128/75     Pulse Rate 12/24/19 1653 (!) 107     Resp 12/24/19 1653 16     Temp 12/24/19 1653 98.2 F (36.8 C)     Temp Source 12/24/19 1653 Oral     SpO2 12/24/19 1653 96 %     Weight 12/24/19 1654 190 lb (86.2 kg)     Height 12/24/19 1654 5\' 10"  (1.778 m)     Head Circumference --      Peak Flow --      Pain Score 12/24/19 1654 7     Pain Loc --      Pain Edu? --      Excl. in [place]? --     Constitutional: Alert and oriented.  Appears intoxicated. Head: Normocephalic and atraumatic. Eyes: Conjunctivae are normal.  Sclera injected. Normal extraocular movements Cardiovascular: Tachycardic, regular rhythm.  Radial pulses 2+ bilaterally Musculoskeletal: Normal flexion, extension and rotation of the right wrist.  Decreased flexion extensions of the fingers of the right hand.  1+ swelling noted over the dorsal aspect of the right hand.  Pain with palpation over the dorsal aspect of the right hand. Neurologic: Speech is slowed, and he is having difficulty keeping his eyes open.  Decreased fine motor coordination of the right hand secondary to swelling. Skin: Redness noted from the PIPs of all fingers right hand up to the carpals.  No warmth noted. ____________________________________________   LABS Labs Reviewed  CBC WITH DIFFERENTIAL/PLATELET - Abnormal; Notable for the following components:      Result Value   RBC 3.84 (*)    Hemoglobin 12.1 (*)    HCT 37.1 (*)    All  other components within normal limits  COMPREHENSIVE METABOLIC PANEL - Abnormal; Notable for the following components:   Glucose, Bld 131 (*)    All other components within normal limits  LACTIC ACID, PLASMA    ____________________________________________ RADIOLOGY   Imaging Orders     DG Hand Complete Right IMPRESSION:  No radiographic evidence of osteomyelitis. Decrease in the  previously seen diffuse hand swelling.    ____________________________________________    INITIAL IMPRESSION / ASSESSMENT AND PLAN / ED COURSE  Abscess/Cellulitis Right Hand:  Improved according to imaging CBC, CMET and Lactic acid  Clindamycin 600 mg IV x 1 RX for Clindamycin 300 mg TID x 10 days Follow up with hand surgery as an outpatient ____________________________________________  FINAL CLINICAL IMPRESSION(S) / ED DIAGNOSES  Final diagnoses:  Abscess of right hand  Cellulitis of right hand      Jearld Fenton, NP 12/24/19 2004    Arta Silence,  MD 12/24/19 2354

## 2021-07-02 ENCOUNTER — Emergency Department
Admission: EM | Admit: 2021-07-02 | Discharge: 2021-07-02 | Discharge status: Against Medical Advice | Payer: BC Managed Care – PPO | Source: Home / Self Care

## 2021-09-05 IMAGING — MR MR [PERSON_NAME]*[PERSON_NAME]* W/O CM
3 series · 40 of 40 positions shown · IV contrast (agent unspecified)
Comparison: right hand radiographs 11/28/2019.

CLINICAL DATA: Dorsal hand pain, swelling and erythema secondary to
cellulitis versus abscess. History of IV drug abuse.

EXAM:
MRI OF THE RIGHT HAND WITHOUT CONTRAST
TECHNIQUE: Multiplanar, multisequence MR imaging of the right hand was
performed. No intravenous contrast was administered. Upon initial
review of the noncontrast study, post-contrast imaging was requested
by Dr. Ronlor. Before that could be performed, the patient
reportedly left the hospital AMA.

[Series 11: T1 · oblique · 3.0mm · 0.47mm/px · 12 of 30 slices shown (1 of 2)]
[im 1/30]
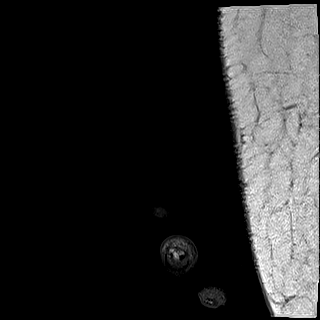
[im 3/30]
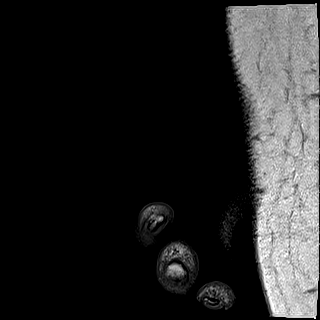
[im 6/30]
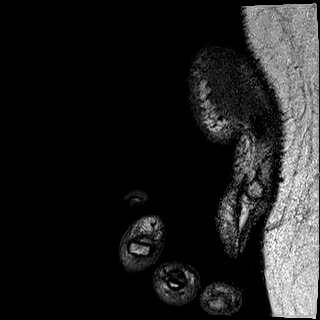
[im 8/30]
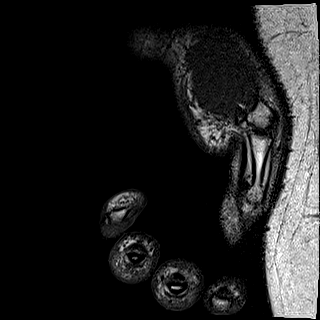
[im 11/30]
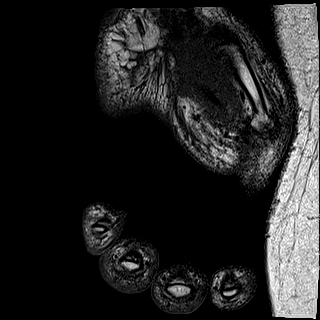
[im 14/30]
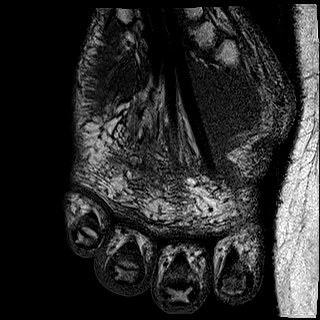
[im 16/30]
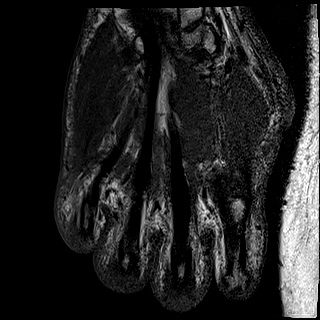
[im 19/30]
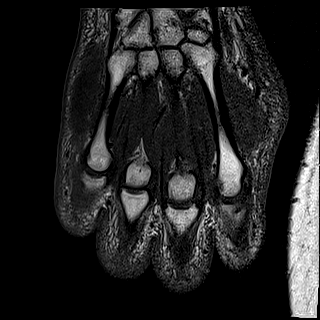
[im 22/30]
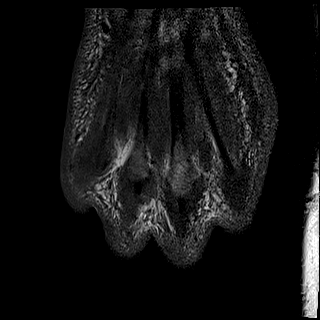
[im 24/30]
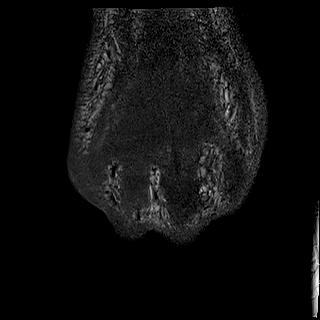
[im 27/30]
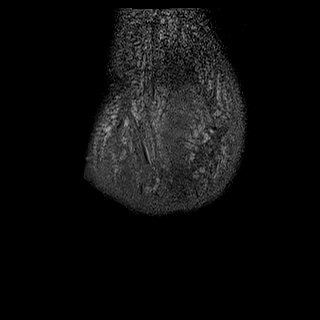
[im 30/30]
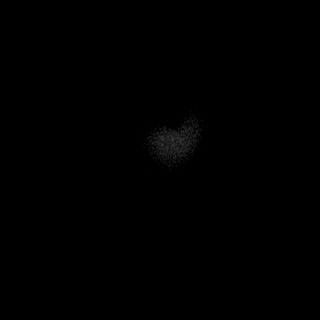

[Series 12: T1 · axial · 4.0mm · 0.36mm/px · z∈[-306,-150]mm · 14 of 36 slices shown (2 of 2)]
[im 1/36]
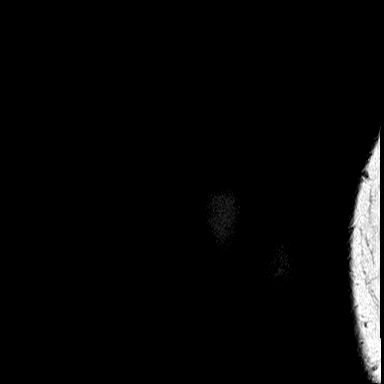
[im 3/36]
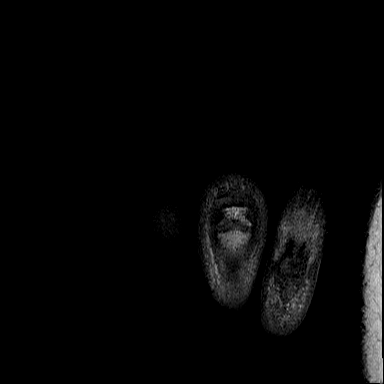
[im 6/36]
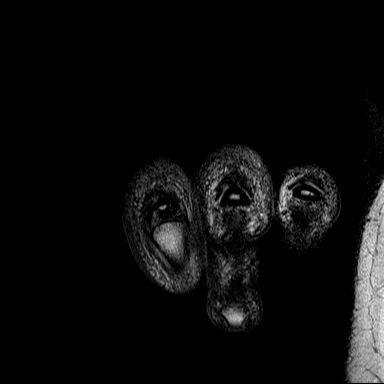
[im 9/36]
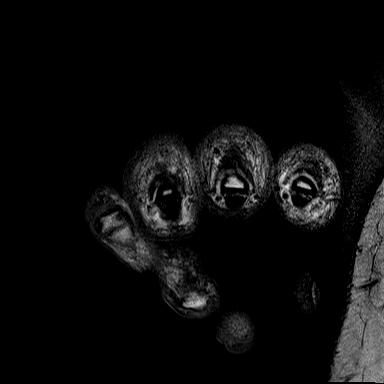
[im 11/36]
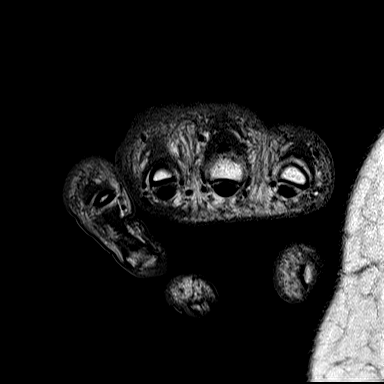
[im 14/36]
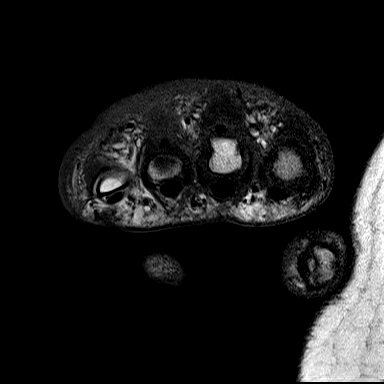
[im 17/36]
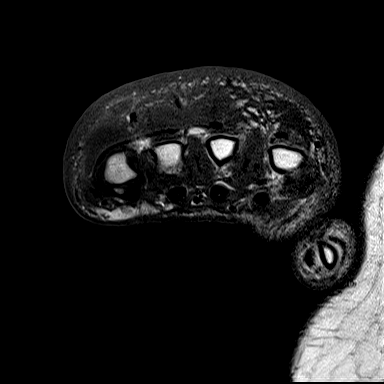
[im 19/36]
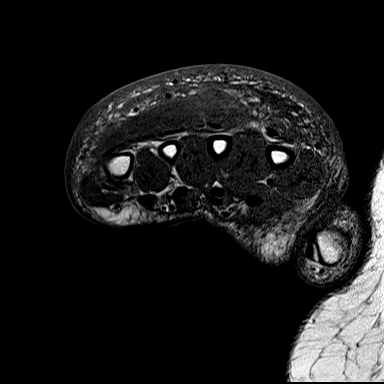
[im 22/36]
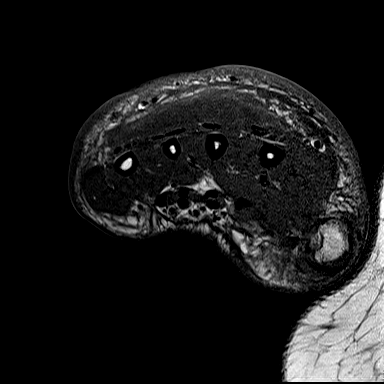
[im 25/36]
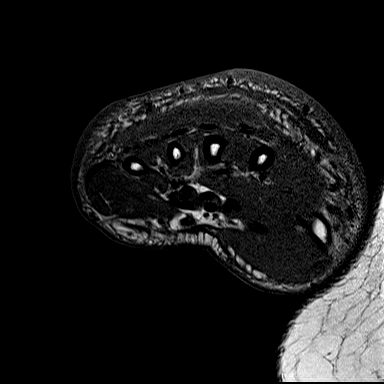
[im 27/36]
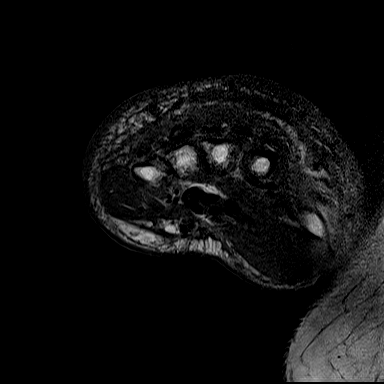
[im 30/36]
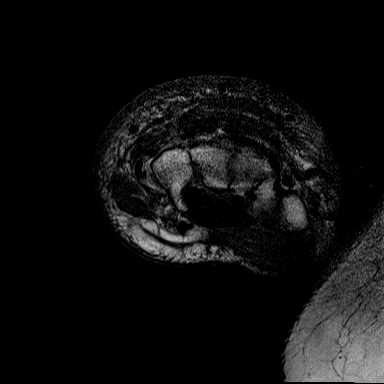
[im 33/36]
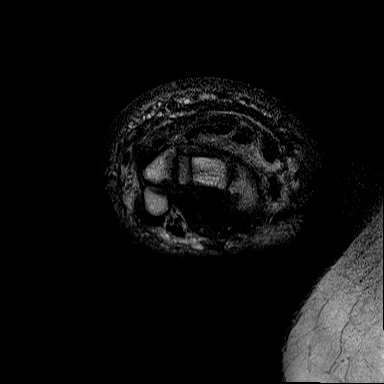
[im 36/36]
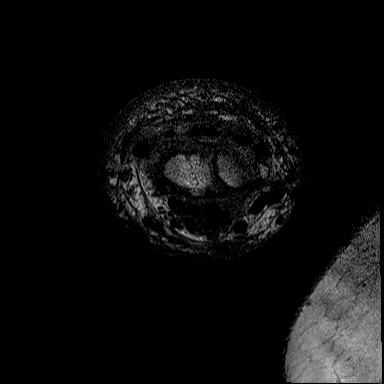

[Series 18: T2 · axial · 4.0mm · 0.44mm/px · z∈[-307,-150]mm · 14 of 36 slices shown]
[im 1/36]
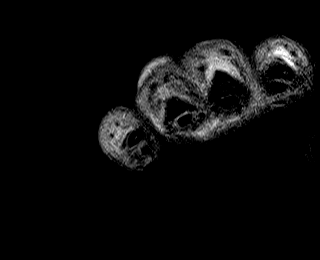
[im 3/36]
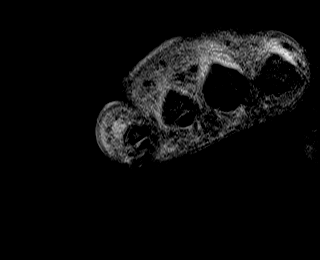
[im 6/36]
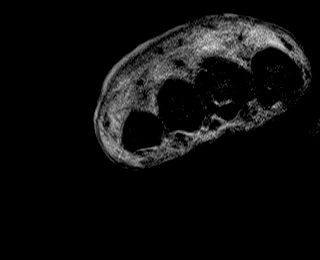
[im 9/36]
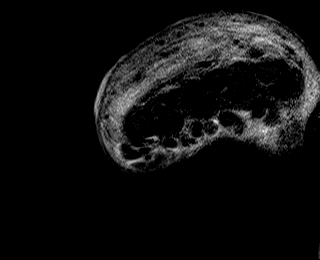
[im 11/36]
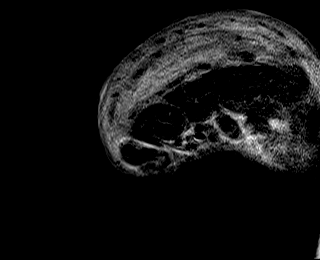
[im 14/36]
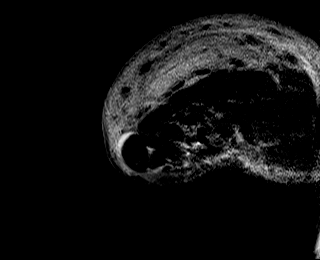
[im 17/36]
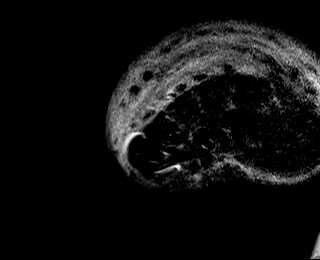
[im 19/36]
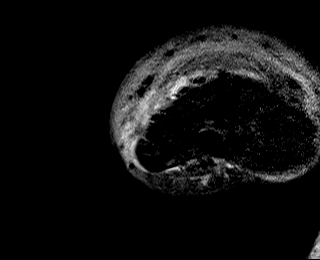
[im 22/36]
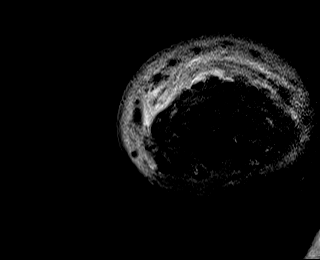
[im 25/36]
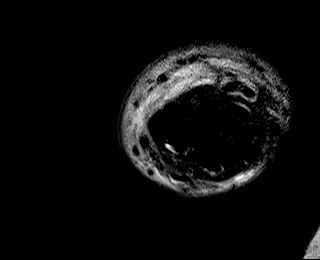
[im 27/36]
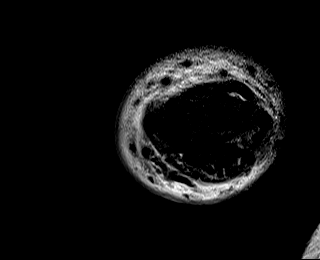
[im 30/36]
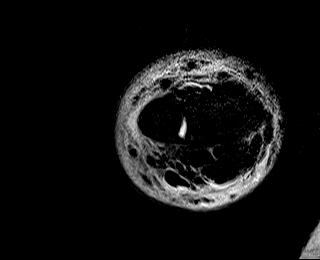
[im 33/36]
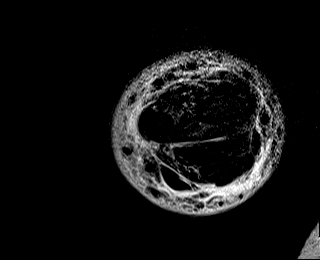
[im 36/36]
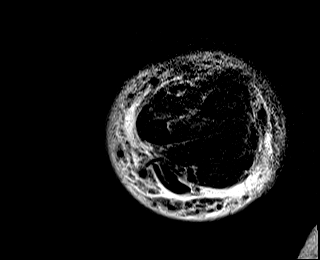

[40 of 40 positions shown; findings below may reference images not displayed]

FINDINGS: Despite efforts by the technologist and patient, mild motion
artifact is present on today's exam and could not be eliminated.
This reduces exam sensitivity and specificity. Motion is greatest on
the sagittal images.

Bones/Joint/Cartilage

Patient was unable to fully extend his fingers. There is no evidence
of acute fracture, dislocation or bone destruction. No marrow edema
or significant joint effusions are seen.

Ligaments

The collateral ligaments of the metacarpal phalangeal joints appear
normal.

Muscles and Tendons

There is mild to moderate extensor tenosynovitis at the level of the
wrist. Sheath fluid is greatest within the extensor digitorum
tendons, especially of the ring finger. No significant tenosynovitis
within the fingers identified.

Soft tissues

As on earlier radiographs, there is diffuse soft tissue swelling,
especially in the dorsum of the hand where there is ill-defined T2
hyperintensity superficial to the extensor tendons measuring up to
5.0 x 1.2 cm on axial image 21/14 and extending 6.8 cm on coronal
image [DATE]. This is suspicious for an ill-defined fluid collection
and extends into the dorsal aspects of the 2nd and 3rd digits. This
collection is incompletely characterized without contrast. No
evidence of foreign body or soft tissue emphysema.
IMPRESSION: 1. Limited examination due to motion artifact. Post-contrast imaging
was requested, but was not performed prior to the patient leaving
the hospital AMA.
2. Diffuse soft tissue swelling in the dorsum of the hand with
ill-defined T2 hyperintensity superficial to the extensor tendons,
suspicious for an ill-defined fluid collection. This is incompletely
characterized without contrast and could reflect a superficial
abscess.
3. Mild to moderate extensor tenosynovitis at the level of the
wrist, potentially infectious.
4. No evidence of osteomyelitis or septic joint.
5. These results will be called to the ordering clinician or
representative by the Radiologist Assistant, and communication
documented in the PACS or [REDACTED].

## 2023-12-15 ENCOUNTER — Other Ambulatory Visit: Payer: Self-pay

## 2023-12-15 ENCOUNTER — Emergency Department
Admission: EM | Admit: 2023-12-15 | Discharge: 2023-12-15 | Disposition: A | Discharge status: Home / Self Care | Attending: Emergency Medicine | Admitting: Emergency Medicine

## 2023-12-15 DIAGNOSIS — X58XXXA Exposure to other specified factors, initial encounter: Secondary | ICD-10-CM | POA: Insufficient documentation

## 2023-12-15 DIAGNOSIS — S3121XA Laceration without foreign body of penis, initial encounter: Secondary | ICD-10-CM | POA: Insufficient documentation

## 2023-12-15 DIAGNOSIS — T40411A Poisoning by fentanyl or fentanyl analogs, accidental (unintentional), initial encounter: Secondary | ICD-10-CM | POA: Insufficient documentation

## 2023-12-15 DIAGNOSIS — S3994XA Unspecified injury of external genitals, initial encounter: Secondary | ICD-10-CM | POA: Diagnosis present

## 2023-12-15 MED ORDER — LIDOCAINE 5 % EX OINT: 1.0000 | TOPICAL_OINTMENT | CUTANEOUS | 0 refills | Status: AC | PRN

## 2023-12-15 MED ORDER — BACITRACIN ZINC 500 UNIT/GM EX OINT
TOPICAL_OINTMENT | Freq: Once | CUTANEOUS | Status: AC
  Administered 2023-12-15: 1 via TOPICAL
  Filled 2023-12-15: qty 0.9

## 2023-12-15 MED ORDER — DOXYCYCLINE HYCLATE 100 MG PO TABS: 100.0000 mg | ORAL_TABLET | Freq: Two times a day (BID) | ORAL | 0 refills | Status: AC

## 2023-12-15 MED ORDER — LIDOCAINE HCL (PF) 1 % IJ SOLN
30.0000 mL | Freq: Once | INTRAMUSCULAR | Status: AC
  Administered 2023-12-15: 30 mL via INTRADERMAL
  Filled 2023-12-15: qty 30

## 2023-12-15 NOTE — ED Provider Notes (Signed)
 [facility] Provider Note   Event Date/Time   First MD Initiated Contact with Patient 12/15/23 [phone]     (approximate) History  Laceration  HPI Raymond Cole is a 28 y.o. male with past medical history of opiate abuse who presents complaining of of a laceration to the dorsal base of the penis.  Patient states that he was using fentanyl  last night and must have used too much.  Patient states that when he came to he noticed the laceration around the base of his penis.  Patient states he does not know how this laceration occurred. ROS: Patient currently denies any vision changes, tinnitus, difficulty speaking, facial droop, sore throat, chest pain, shortness of breath, abdominal pain, nausea/vomiting/diarrhea, dysuria, or weakness/numbness/paresthesias in any extremity   Physical Exam  Triage Vital Signs: ED Triage Vitals  Encounter Vitals Group     BP 12/15/23 0339 133/85     Systolic BP Percentile --      Diastolic BP Percentile --      Pulse Rate 12/15/23 0339 (!) 103     Resp 12/15/23 0339 18     Temp 12/15/23 0339 97.8 F (36.6 C)     Temp Source 12/15/23 0339 Oral     SpO2 12/15/23 0339 100 %     Weight 12/15/23 0338 200 lb (90.7 kg)     Height 12/15/23 0338 5\' 10"  (1.778 m)     Head Circumference --      Peak Flow --      Pain Score 12/15/23 0338 0     Pain Loc --      Pain Education --      Exclude from Growth Chart --    Most recent vital signs: Vitals:   12/15/23 0634 12/15/23 0822  BP: 114/79 114/76  Pulse: 74 71  Resp: 16   Temp:    SpO2: 97% 97%   General: Awake, oriented x4. CV:  Good peripheral perfusion.  Resp:  Normal effort.  Abd:  No distention.  Other:  Young adult overweight Caucasian male resting comfortably in no acute distress.  5 cm curvilinear laceration around the base of the penis ED Results / Procedures / Treatments  PROCEDURES: Critical Care performed: No .Laceration Repair  Date/Time: 12/15/2023 8:29  AM  Performed by: Charleen Conn, MD Authorized by: Charleen Conn, MD   Consent:    Consent obtained:  Verbal   Consent given by:  Patient   Risks, benefits, and alternatives were discussed: yes     Risks discussed:  Infection, pain, retained foreign body, need for additional repair, poor cosmetic result, tendon damage, vascular damage, poor wound healing and nerve damage   Alternatives discussed:  No treatment, delayed treatment, observation and referral Universal protocol:    Immediately prior to procedure, a time out was called: yes     Patient identity confirmed:  Verbally with patient Anesthesia:    Anesthesia method:  Local infiltration Laceration details:    Location:  Anogenital   Anogenital location:  Penis   Length (cm):  5   Depth (mm):  5 Pre-procedure details:    Preparation:  Patient was prepped and draped in usual sterile fashion Exploration:    Wound exploration: entire depth of wound visualized     Contaminated: no   Treatment:    Area cleansed with:  Povidone-iodine and saline   Amount of cleaning:  Standard   Irrigation solution:  Sterile saline   Irrigation method:  Syringe  Skin repair:    Repair method:  Sutures   Suture size:  4-0   Suture material:  Nylon   Suture technique:  Running locked   Number of sutures:  6 Approximation:    Approximation:  Close Repair type:    Repair type:  Simple Post-procedure details:    Dressing:  Antibiotic ointment and non-adherent dressing   Procedure completion:  Tolerated well, no immediate complications  MEDICATIONS ORDERED IN ED: Medications  lidocaine  (PF) (XYLOCAINE ) 1 % injection 30 mL (30 mLs Intradermal Given by Other 12/15/23 0750)  bacitracin  ointment (1 Application Topical Given 12/15/23 1610)   IMPRESSION / MDM / ASSESSMENT AND PLAN / ED COURSE  I reviewed the triage vital signs and the nursing notes.                             The patient is on the cardiac monitor to evaluate for evidence of  arrhythmia and/or significant heart rate changes. Patient's presentation is most consistent with acute presentation with potential threat to life or bodily function. Patient had a laceration that was repaired in the ED after copious irrigation.  Please see laceration procedure note for further details.  After exploration of the wound, there was no evidence of a retained foreign body. No evidence of underlying fracture. TDAP: UTD Interventions: Given unknown source of this laceration, will cover empirically with doxycycline  Disposition: Discharge. Patient has been given strict wound return precautions and instructions to follow up with their PMD in 2 days for a wound recheck.   FINAL CLINICAL IMPRESSION(S) / ED DIAGNOSES   Final diagnoses:  Laceration of penis, initial encounter   Rx / DC Orders   ED Discharge Orders          Ordered    lidocaine  (XYLOCAINE ) 5 % ointment  As needed        12/15/23 0803    doxycycline  (VIBRA -TABS) 100 MG tablet  2 times daily        12/15/23 0803           Note:  This document was prepared using Dragon voice recognition software and may include unintentional dictation errors.   Charleen Conn, MD 12/15/23 [phone]

## 2023-12-15 NOTE — ED Triage Notes (Signed)
 Pt reports he was in the bathroom shaving his head and he did some fentanyl  pt OD and EMS was called to scene pt states when he came to he noticed laceration to base of penis, around top and sides. Pt states he does not know how he got laceration. Bleeding controlled.
